# Patient Record
Sex: Female | Born: 1960
Health system: Southern US, Community
[De-identification: ages and names within clinical notes are randomized; demographics above are authoritative.]

## PROBLEM LIST (undated history)

## (undated) DIAGNOSIS — E785 Hyperlipidemia, unspecified: Secondary | ICD-10-CM

## (undated) DIAGNOSIS — M1990 Unspecified osteoarthritis, unspecified site: Secondary | ICD-10-CM

## (undated) DIAGNOSIS — N200 Calculus of kidney: Secondary | ICD-10-CM

## (undated) DIAGNOSIS — I10 Essential (primary) hypertension: Secondary | ICD-10-CM

## (undated) DIAGNOSIS — K5790 Diverticulosis of intestine, part unspecified, without perforation or abscess without bleeding: Secondary | ICD-10-CM

## (undated) DIAGNOSIS — D649 Anemia, unspecified: Secondary | ICD-10-CM

## (undated) DIAGNOSIS — E079 Disorder of thyroid, unspecified: Secondary | ICD-10-CM

## (undated) HISTORY — DX: Anemia, unspecified: D64.9

## (undated) HISTORY — PX: THUMB ARTHROSCOPY: SHX2509

## (undated) HISTORY — PX: ELBOW SURGERY: SHX618

## (undated) HISTORY — DX: Hyperlipidemia, unspecified: E78.5

## (undated) HISTORY — PX: TOE SURGERY: SHX1073

## (undated) HISTORY — DX: Unspecified osteoarthritis, unspecified site: M19.90

## (undated) HISTORY — PX: COLONOSCOPY: SHX174

## (undated) HISTORY — PX: TONSILLECTOMY: SUR1361

## (undated) HISTORY — DX: Disorder of thyroid, unspecified: E07.9

## (undated) HISTORY — PX: TONSILECTOMY, ADENOIDECTOMY, BILATERAL MYRINGOTOMY AND TUBES: SHX2538

## (undated) HISTORY — PX: LITHOTRIPSY: SUR834

## (undated) HISTORY — PX: REPLACEMENT TOTAL KNEE: SUR1224

## (undated) HISTORY — DX: Diverticulosis of intestine, part unspecified, without perforation or abscess without bleeding: K57.90

## (undated) HISTORY — DX: Essential (primary) hypertension: I10

## (undated) HISTORY — PX: CARPAL TUNNEL RELEASE: SHX101

## (undated) HISTORY — DX: Calculus of kidney: N20.0

## (undated) HISTORY — PX: POLYPECTOMY: SHX149

---

## 1997-07-13 ENCOUNTER — Other Ambulatory Visit: Admission: RE | Admit: 1997-07-13 | Discharge: 1997-07-13 | Payer: Self-pay | Admitting: Obstetrics and Gynecology

## 2002-12-22 ENCOUNTER — Encounter: Admission: RE | Admit: 2002-12-22 | Discharge: 2002-12-22 | Payer: Self-pay | Admitting: Obstetrics and Gynecology

## 2005-03-16 ENCOUNTER — Encounter: Admission: RE | Admit: 2005-03-16 | Discharge: 2005-03-16 | Payer: Self-pay | Admitting: Obstetrics and Gynecology

## 2006-10-22 ENCOUNTER — Encounter: Admission: RE | Admit: 2006-10-22 | Discharge: 2006-10-22 | Payer: Self-pay | Admitting: Obstetrics and Gynecology

## 2006-10-25 ENCOUNTER — Encounter: Admission: RE | Admit: 2006-10-25 | Discharge: 2006-10-25 | Payer: Self-pay | Admitting: Obstetrics and Gynecology

## 2007-11-25 ENCOUNTER — Ambulatory Visit: Payer: Self-pay

## 2008-12-22 ENCOUNTER — Encounter: Admission: RE | Admit: 2008-12-22 | Discharge: 2008-12-22 | Payer: Self-pay | Admitting: Obstetrics and Gynecology

## 2009-10-05 ENCOUNTER — Inpatient Hospital Stay (HOSPITAL_COMMUNITY): Admission: RE | Admit: 2009-10-05 | Discharge: 2009-10-08 | Payer: Self-pay | Admitting: Urology

## 2009-12-27 ENCOUNTER — Encounter: Admission: RE | Admit: 2009-12-27 | Discharge: 2009-12-27 | Payer: Self-pay | Admitting: Obstetrics and Gynecology

## 2010-02-03 ENCOUNTER — Encounter (INDEPENDENT_AMBULATORY_CARE_PROVIDER_SITE_OTHER): Payer: Self-pay | Admitting: Internal Medicine

## 2010-02-03 ENCOUNTER — Ambulatory Visit (HOSPITAL_COMMUNITY)
Admission: RE | Admit: 2010-02-03 | Discharge: 2010-02-03 | Payer: Self-pay | Source: Home / Self Care | Attending: Internal Medicine | Admitting: Internal Medicine

## 2010-02-03 ENCOUNTER — Encounter: Payer: Self-pay | Admitting: Cardiology

## 2010-02-03 ENCOUNTER — Ambulatory Visit: Admission: RE | Admit: 2010-02-03 | Discharge: 2010-02-03 | Payer: Self-pay | Source: Home / Self Care

## 2010-02-27 ENCOUNTER — Encounter: Payer: Self-pay | Admitting: Obstetrics and Gynecology

## 2010-04-20 LAB — URINE CULTURE
Colony Count: 75000
Culture  Setup Time: 201109031410
Special Requests: NEGATIVE

## 2010-04-20 LAB — URINE MICROSCOPIC-ADD ON

## 2010-04-20 LAB — URINALYSIS, ROUTINE W REFLEX MICROSCOPIC
Bilirubin Urine: NEGATIVE
Glucose, UA: NEGATIVE mg/dL
Ketones, ur: NEGATIVE mg/dL
Nitrite: NEGATIVE
Protein, ur: 30 mg/dL — AB
Specific Gravity, Urine: 1.01 (ref 1.005–1.030)
Urobilinogen, UA: 1 mg/dL (ref 0.0–1.0)
pH: 6 (ref 5.0–8.0)

## 2010-04-20 LAB — CBC
HCT: 27.9 % — ABNORMAL LOW (ref 36.0–46.0)
HCT: 30.3 % — ABNORMAL LOW (ref 36.0–46.0)
Hemoglobin: 10.4 g/dL — ABNORMAL LOW (ref 12.0–15.0)
Hemoglobin: 9.6 g/dL — ABNORMAL LOW (ref 12.0–15.0)
MCH: 30.8 pg (ref 26.0–34.0)
MCH: 30.9 pg (ref 26.0–34.0)
MCHC: 34.2 g/dL (ref 30.0–36.0)
MCHC: 34.5 g/dL (ref 30.0–36.0)
MCV: 89.1 fL (ref 78.0–100.0)
MCV: 90.4 fL (ref 78.0–100.0)
Platelets: 330 10*3/uL (ref 150–400)
Platelets: 339 10*3/uL (ref 150–400)
RBC: 3.14 MIL/uL — ABNORMAL LOW (ref 3.87–5.11)
RBC: 3.35 MIL/uL — ABNORMAL LOW (ref 3.87–5.11)
RDW: 12.9 % (ref 11.5–15.5)
RDW: 13.4 % (ref 11.5–15.5)
WBC: 13.1 10*3/uL — ABNORMAL HIGH (ref 4.0–10.5)
WBC: 14.7 10*3/uL — ABNORMAL HIGH (ref 4.0–10.5)

## 2010-04-21 LAB — PREGNANCY, URINE: Preg Test, Ur: NEGATIVE

## 2010-04-21 LAB — CBC
HCT: 39.5 % (ref 36.0–46.0)
Hemoglobin: 13.4 g/dL (ref 12.0–15.0)
MCH: 30.4 pg (ref 26.0–34.0)
MCHC: 33.8 g/dL (ref 30.0–36.0)
MCV: 89.9 fL (ref 78.0–100.0)
Platelets: 376 10*3/uL (ref 150–400)
RBC: 4.39 MIL/uL (ref 3.87–5.11)
RDW: 13.3 % (ref 11.5–15.5)
WBC: 6 10*3/uL (ref 4.0–10.5)

## 2010-04-21 LAB — SURGICAL PCR SCREEN
MRSA, PCR: NEGATIVE
Staphylococcus aureus: POSITIVE — AB

## 2010-07-17 ENCOUNTER — Encounter: Payer: Self-pay | Admitting: Internal Medicine

## 2010-08-08 ENCOUNTER — Encounter: Payer: Self-pay | Admitting: Internal Medicine

## 2010-08-08 ENCOUNTER — Ambulatory Visit (AMBULATORY_SURGERY_CENTER): Payer: BC Managed Care – PPO

## 2010-08-08 VITALS — Ht 62.0 in | Wt 167.2 lb

## 2010-08-08 DIAGNOSIS — Z1211 Encounter for screening for malignant neoplasm of colon: Secondary | ICD-10-CM

## 2010-08-08 MED ORDER — PEG-KCL-NACL-NASULF-NA ASC-C 100 G PO SOLR: 1.0000 | Freq: Once | ORAL | Status: AC

## 2010-08-22 ENCOUNTER — Ambulatory Visit (AMBULATORY_SURGERY_CENTER): Payer: BC Managed Care – PPO | Admitting: Internal Medicine

## 2010-08-22 ENCOUNTER — Encounter: Payer: Self-pay | Admitting: Internal Medicine

## 2010-08-22 VITALS — BP 111/67 | HR 62 | Temp 97.7°F | Resp 17 | Ht 62.0 in | Wt 164.0 lb

## 2010-08-22 DIAGNOSIS — D126 Benign neoplasm of colon, unspecified: Secondary | ICD-10-CM

## 2010-08-22 DIAGNOSIS — Z1211 Encounter for screening for malignant neoplasm of colon: Secondary | ICD-10-CM

## 2010-08-22 DIAGNOSIS — K6389 Other specified diseases of intestine: Secondary | ICD-10-CM

## 2010-08-22 DIAGNOSIS — D1330 Benign neoplasm of unspecified part of small intestine: Secondary | ICD-10-CM

## 2010-08-22 DIAGNOSIS — K5730 Diverticulosis of large intestine without perforation or abscess without bleeding: Secondary | ICD-10-CM

## 2010-08-22 DIAGNOSIS — K648 Other hemorrhoids: Secondary | ICD-10-CM

## 2010-08-22 HISTORY — PX: COLONOSCOPY W/ BIOPSIES: SHX1374

## 2010-08-22 MED ORDER — SODIUM CHLORIDE 0.9 % IV SOLN: 500.0000 mL | INTRAVENOUS | Status: DC

## 2010-08-22 NOTE — Patient Instructions (Signed)
Please refer to your blue and neon green sheets for instructions regarding diet and activity for the rest of today.  You may resume your medications as you would normally take them.  

## 2010-08-23 ENCOUNTER — Telehealth: Payer: Self-pay

## 2010-08-23 NOTE — Telephone Encounter (Signed)

## 2010-08-30 ENCOUNTER — Encounter: Payer: Self-pay | Admitting: Internal Medicine

## 2010-08-30 DIAGNOSIS — Z860100 Personal history of colon polyps, unspecified: Secondary | ICD-10-CM | POA: Insufficient documentation

## 2010-08-30 NOTE — Progress Notes (Signed)
Quick Note:  3 mm adenoma Recall colonoscopy 7 years 2019 ______

## 2010-11-30 ENCOUNTER — Other Ambulatory Visit: Payer: Self-pay | Admitting: Internal Medicine

## 2010-11-30 DIAGNOSIS — Z1231 Encounter for screening mammogram for malignant neoplasm of breast: Secondary | ICD-10-CM

## 2011-01-01 ENCOUNTER — Ambulatory Visit: Payer: BC Managed Care – PPO

## 2011-01-22 ENCOUNTER — Ambulatory Visit
Admission: RE | Admit: 2011-01-22 | Discharge: 2011-01-22 | Disposition: A | Discharge status: Home / Self Care | Payer: BC Managed Care – PPO | Source: Ambulatory Visit | Attending: Internal Medicine | Admitting: Internal Medicine

## 2011-01-22 DIAGNOSIS — Z1231 Encounter for screening mammogram for malignant neoplasm of breast: Secondary | ICD-10-CM

## 2011-12-18 ENCOUNTER — Other Ambulatory Visit: Payer: Self-pay | Admitting: Internal Medicine

## 2011-12-18 DIAGNOSIS — Z1231 Encounter for screening mammogram for malignant neoplasm of breast: Secondary | ICD-10-CM

## 2012-02-01 ENCOUNTER — Ambulatory Visit: Payer: BC Managed Care – PPO

## 2012-02-01 ENCOUNTER — Ambulatory Visit
Admission: RE | Admit: 2012-02-01 | Discharge: 2012-02-01 | Disposition: A | Discharge status: Home / Self Care | Payer: BC Managed Care – PPO | Source: Ambulatory Visit | Attending: Internal Medicine | Admitting: Internal Medicine

## 2012-02-01 DIAGNOSIS — Z1231 Encounter for screening mammogram for malignant neoplasm of breast: Secondary | ICD-10-CM

## 2012-12-05 ENCOUNTER — Other Ambulatory Visit: Payer: Self-pay

## 2012-12-05 DIAGNOSIS — Z1231 Encounter for screening mammogram for malignant neoplasm of breast: Secondary | ICD-10-CM

## 2013-02-02 ENCOUNTER — Ambulatory Visit: Payer: BC Managed Care – PPO

## 2014-02-05 HISTORY — PX: BREAST BIOPSY: SHX20

## 2015-02-01 ENCOUNTER — Other Ambulatory Visit: Payer: Self-pay | Admitting: Obstetrics and Gynecology

## 2015-02-01 DIAGNOSIS — R928 Other abnormal and inconclusive findings on diagnostic imaging of breast: Secondary | ICD-10-CM

## 2015-02-09 ENCOUNTER — Ambulatory Visit
Admission: RE | Admit: 2015-02-09 | Discharge: 2015-02-09 | Disposition: A | Discharge status: Home / Self Care | Payer: BLUE CROSS/BLUE SHIELD | Source: Ambulatory Visit | Attending: Obstetrics and Gynecology | Admitting: Obstetrics and Gynecology

## 2015-02-09 ENCOUNTER — Other Ambulatory Visit: Payer: Self-pay | Admitting: Obstetrics and Gynecology

## 2015-02-09 DIAGNOSIS — R928 Other abnormal and inconclusive findings on diagnostic imaging of breast: Secondary | ICD-10-CM

## 2015-02-14 ENCOUNTER — Other Ambulatory Visit: Payer: Self-pay | Admitting: Obstetrics and Gynecology

## 2015-02-14 DIAGNOSIS — R928 Other abnormal and inconclusive findings on diagnostic imaging of breast: Secondary | ICD-10-CM

## 2015-02-15 ENCOUNTER — Ambulatory Visit
Admission: RE | Admit: 2015-02-15 | Discharge: 2015-02-15 | Disposition: A | Discharge status: Home / Self Care | Payer: BLUE CROSS/BLUE SHIELD | Source: Ambulatory Visit | Attending: Obstetrics and Gynecology | Admitting: Obstetrics and Gynecology

## 2015-02-15 DIAGNOSIS — R928 Other abnormal and inconclusive findings on diagnostic imaging of breast: Secondary | ICD-10-CM

## 2015-08-29 DIAGNOSIS — N200 Calculus of kidney: Secondary | ICD-10-CM | POA: Diagnosis not present

## 2015-09-12 DIAGNOSIS — E559 Vitamin D deficiency, unspecified: Secondary | ICD-10-CM | POA: Diagnosis not present

## 2015-09-12 DIAGNOSIS — I10 Essential (primary) hypertension: Secondary | ICD-10-CM | POA: Diagnosis not present

## 2015-09-12 DIAGNOSIS — N390 Urinary tract infection, site not specified: Secondary | ICD-10-CM | POA: Diagnosis not present

## 2015-09-12 DIAGNOSIS — Z0000 Encounter for general adult medical examination without abnormal findings: Secondary | ICD-10-CM | POA: Diagnosis not present

## 2015-09-12 DIAGNOSIS — E784 Other hyperlipidemia: Secondary | ICD-10-CM | POA: Diagnosis not present

## 2015-09-12 DIAGNOSIS — E038 Other specified hypothyroidism: Secondary | ICD-10-CM | POA: Diagnosis not present

## 2015-09-12 DIAGNOSIS — R8299 Other abnormal findings in urine: Secondary | ICD-10-CM | POA: Diagnosis not present

## 2015-09-16 DIAGNOSIS — E038 Other specified hypothyroidism: Secondary | ICD-10-CM | POA: Diagnosis not present

## 2015-09-16 DIAGNOSIS — Z0000 Encounter for general adult medical examination without abnormal findings: Secondary | ICD-10-CM | POA: Diagnosis not present

## 2015-09-16 DIAGNOSIS — E668 Other obesity: Secondary | ICD-10-CM | POA: Diagnosis not present

## 2015-09-16 DIAGNOSIS — N200 Calculus of kidney: Secondary | ICD-10-CM | POA: Diagnosis not present

## 2015-09-16 DIAGNOSIS — Z6832 Body mass index (BMI) 32.0-32.9, adult: Secondary | ICD-10-CM | POA: Diagnosis not present

## 2015-09-16 DIAGNOSIS — I10 Essential (primary) hypertension: Secondary | ICD-10-CM | POA: Diagnosis not present

## 2015-09-16 DIAGNOSIS — Z1389 Encounter for screening for other disorder: Secondary | ICD-10-CM | POA: Diagnosis not present

## 2015-09-22 DIAGNOSIS — Z1212 Encounter for screening for malignant neoplasm of rectum: Secondary | ICD-10-CM | POA: Diagnosis not present

## 2015-10-17 DIAGNOSIS — Z13820 Encounter for screening for osteoporosis: Secondary | ICD-10-CM | POA: Diagnosis not present

## 2015-10-17 DIAGNOSIS — E559 Vitamin D deficiency, unspecified: Secondary | ICD-10-CM | POA: Diagnosis not present

## 2015-11-29 DIAGNOSIS — G5602 Carpal tunnel syndrome, left upper limb: Secondary | ICD-10-CM | POA: Diagnosis not present

## 2015-11-29 DIAGNOSIS — H524 Presbyopia: Secondary | ICD-10-CM | POA: Diagnosis not present

## 2015-12-06 DIAGNOSIS — S134XXA Sprain of ligaments of cervical spine, initial encounter: Secondary | ICD-10-CM | POA: Diagnosis not present

## 2015-12-06 DIAGNOSIS — S335XXA Sprain of ligaments of lumbar spine, initial encounter: Secondary | ICD-10-CM | POA: Diagnosis not present

## 2015-12-06 DIAGNOSIS — M4004 Postural kyphosis, thoracic region: Secondary | ICD-10-CM | POA: Diagnosis not present

## 2015-12-06 DIAGNOSIS — M546 Pain in thoracic spine: Secondary | ICD-10-CM | POA: Diagnosis not present

## 2015-12-17 DIAGNOSIS — G5602 Carpal tunnel syndrome, left upper limb: Secondary | ICD-10-CM | POA: Diagnosis not present

## 2015-12-23 ENCOUNTER — Other Ambulatory Visit: Payer: Self-pay | Admitting: Obstetrics and Gynecology

## 2015-12-23 DIAGNOSIS — Z1231 Encounter for screening mammogram for malignant neoplasm of breast: Secondary | ICD-10-CM

## 2016-01-11 DIAGNOSIS — G5622 Lesion of ulnar nerve, left upper limb: Secondary | ICD-10-CM | POA: Diagnosis not present

## 2016-01-11 DIAGNOSIS — M1812 Unilateral primary osteoarthritis of first carpometacarpal joint, left hand: Secondary | ICD-10-CM | POA: Diagnosis not present

## 2016-01-11 DIAGNOSIS — G5602 Carpal tunnel syndrome, left upper limb: Secondary | ICD-10-CM | POA: Diagnosis not present

## 2016-01-17 DIAGNOSIS — G5622 Lesion of ulnar nerve, left upper limb: Secondary | ICD-10-CM | POA: Diagnosis not present

## 2016-01-17 DIAGNOSIS — G5602 Carpal tunnel syndrome, left upper limb: Secondary | ICD-10-CM | POA: Diagnosis not present

## 2016-01-17 DIAGNOSIS — M1812 Unilateral primary osteoarthritis of first carpometacarpal joint, left hand: Secondary | ICD-10-CM | POA: Diagnosis not present

## 2016-01-17 DIAGNOSIS — G5621 Lesion of ulnar nerve, right upper limb: Secondary | ICD-10-CM | POA: Diagnosis not present

## 2016-01-17 DIAGNOSIS — M13142 Monoarthritis, not elsewhere classified, left hand: Secondary | ICD-10-CM | POA: Diagnosis not present

## 2016-01-25 DIAGNOSIS — G5602 Carpal tunnel syndrome, left upper limb: Secondary | ICD-10-CM | POA: Diagnosis not present

## 2016-01-25 DIAGNOSIS — Z4789 Encounter for other orthopedic aftercare: Secondary | ICD-10-CM | POA: Diagnosis not present

## 2016-01-25 DIAGNOSIS — M25522 Pain in left elbow: Secondary | ICD-10-CM | POA: Diagnosis not present

## 2016-01-31 DIAGNOSIS — M25522 Pain in left elbow: Secondary | ICD-10-CM | POA: Diagnosis not present

## 2016-02-01 ENCOUNTER — Ambulatory Visit
Admission: RE | Admit: 2016-02-01 | Discharge: 2016-02-01 | Disposition: A | Discharge status: Home / Self Care | Payer: BLUE CROSS/BLUE SHIELD | Source: Ambulatory Visit | Attending: Obstetrics and Gynecology | Admitting: Obstetrics and Gynecology

## 2016-02-01 DIAGNOSIS — Z130 Encounter for screening for diseases of the blood and blood-forming organs and certain disorders involving the immune mechanism: Secondary | ICD-10-CM | POA: Diagnosis not present

## 2016-02-01 DIAGNOSIS — Z1231 Encounter for screening mammogram for malignant neoplasm of breast: Secondary | ICD-10-CM | POA: Diagnosis not present

## 2016-02-01 DIAGNOSIS — Z124 Encounter for screening for malignant neoplasm of cervix: Secondary | ICD-10-CM | POA: Diagnosis not present

## 2016-02-01 DIAGNOSIS — Z01419 Encounter for gynecological examination (general) (routine) without abnormal findings: Secondary | ICD-10-CM | POA: Diagnosis not present

## 2016-02-01 DIAGNOSIS — Z1389 Encounter for screening for other disorder: Secondary | ICD-10-CM | POA: Diagnosis not present

## 2016-03-16 DIAGNOSIS — I10 Essential (primary) hypertension: Secondary | ICD-10-CM | POA: Diagnosis not present

## 2016-03-16 DIAGNOSIS — N393 Stress incontinence (female) (male): Secondary | ICD-10-CM | POA: Diagnosis not present

## 2016-03-16 DIAGNOSIS — G4709 Other insomnia: Secondary | ICD-10-CM | POA: Diagnosis not present

## 2016-03-16 DIAGNOSIS — Z23 Encounter for immunization: Secondary | ICD-10-CM | POA: Diagnosis not present

## 2016-03-16 DIAGNOSIS — E038 Other specified hypothyroidism: Secondary | ICD-10-CM | POA: Diagnosis not present

## 2016-03-16 DIAGNOSIS — E668 Other obesity: Secondary | ICD-10-CM | POA: Diagnosis not present

## 2016-10-05 DIAGNOSIS — E038 Other specified hypothyroidism: Secondary | ICD-10-CM | POA: Diagnosis not present

## 2016-10-05 DIAGNOSIS — Z0000 Encounter for general adult medical examination without abnormal findings: Secondary | ICD-10-CM | POA: Diagnosis not present

## 2016-10-05 DIAGNOSIS — I10 Essential (primary) hypertension: Secondary | ICD-10-CM | POA: Diagnosis not present

## 2016-10-05 DIAGNOSIS — E559 Vitamin D deficiency, unspecified: Secondary | ICD-10-CM | POA: Diagnosis not present

## 2016-10-12 DIAGNOSIS — Z1389 Encounter for screening for other disorder: Secondary | ICD-10-CM | POA: Diagnosis not present

## 2016-10-12 DIAGNOSIS — E668 Other obesity: Secondary | ICD-10-CM | POA: Diagnosis not present

## 2016-10-12 DIAGNOSIS — Z0000 Encounter for general adult medical examination without abnormal findings: Secondary | ICD-10-CM | POA: Diagnosis not present

## 2016-10-12 DIAGNOSIS — I10 Essential (primary) hypertension: Secondary | ICD-10-CM | POA: Diagnosis not present

## 2016-10-12 DIAGNOSIS — G4709 Other insomnia: Secondary | ICD-10-CM | POA: Diagnosis not present

## 2016-10-12 DIAGNOSIS — E038 Other specified hypothyroidism: Secondary | ICD-10-CM | POA: Diagnosis not present

## 2016-10-19 DIAGNOSIS — Z1212 Encounter for screening for malignant neoplasm of rectum: Secondary | ICD-10-CM | POA: Diagnosis not present

## 2016-10-22 DIAGNOSIS — M25521 Pain in right elbow: Secondary | ICD-10-CM | POA: Diagnosis not present

## 2016-10-22 DIAGNOSIS — G5601 Carpal tunnel syndrome, right upper limb: Secondary | ICD-10-CM | POA: Diagnosis not present

## 2016-10-22 DIAGNOSIS — M79641 Pain in right hand: Secondary | ICD-10-CM | POA: Diagnosis not present

## 2016-12-14 DIAGNOSIS — M1812 Unilateral primary osteoarthritis of first carpometacarpal joint, left hand: Secondary | ICD-10-CM | POA: Diagnosis not present

## 2016-12-14 DIAGNOSIS — G5601 Carpal tunnel syndrome, right upper limb: Secondary | ICD-10-CM | POA: Diagnosis not present

## 2016-12-14 DIAGNOSIS — M79641 Pain in right hand: Secondary | ICD-10-CM | POA: Diagnosis not present

## 2017-01-17 ENCOUNTER — Other Ambulatory Visit: Payer: Self-pay | Admitting: Obstetrics and Gynecology

## 2017-01-17 DIAGNOSIS — Z1231 Encounter for screening mammogram for malignant neoplasm of breast: Secondary | ICD-10-CM

## 2017-02-04 ENCOUNTER — Ambulatory Visit
Admission: RE | Admit: 2017-02-04 | Discharge: 2017-02-04 | Disposition: A | Discharge status: Home / Self Care | Payer: BLUE CROSS/BLUE SHIELD | Source: Ambulatory Visit | Attending: Obstetrics and Gynecology | Admitting: Obstetrics and Gynecology

## 2017-02-04 DIAGNOSIS — Z1231 Encounter for screening mammogram for malignant neoplasm of breast: Secondary | ICD-10-CM | POA: Diagnosis not present

## 2017-02-04 DIAGNOSIS — R87619 Unspecified abnormal cytological findings in specimens from cervix uteri: Secondary | ICD-10-CM | POA: Diagnosis not present

## 2017-02-04 DIAGNOSIS — Z01419 Encounter for gynecological examination (general) (routine) without abnormal findings: Secondary | ICD-10-CM | POA: Diagnosis not present

## 2017-02-04 DIAGNOSIS — Z124 Encounter for screening for malignant neoplasm of cervix: Secondary | ICD-10-CM | POA: Diagnosis not present

## 2017-02-04 DIAGNOSIS — Z130 Encounter for screening for diseases of the blood and blood-forming organs and certain disorders involving the immune mechanism: Secondary | ICD-10-CM | POA: Diagnosis not present

## 2017-02-04 DIAGNOSIS — Z6831 Body mass index (BMI) 31.0-31.9, adult: Secondary | ICD-10-CM | POA: Diagnosis not present

## 2017-02-04 DIAGNOSIS — Z1389 Encounter for screening for other disorder: Secondary | ICD-10-CM | POA: Diagnosis not present

## 2017-02-04 DIAGNOSIS — R87610 Atypical squamous cells of undetermined significance on cytologic smear of cervix (ASC-US): Secondary | ICD-10-CM | POA: Diagnosis not present

## 2017-02-07 ENCOUNTER — Emergency Department (HOSPITAL_COMMUNITY): Payer: BLUE CROSS/BLUE SHIELD

## 2017-02-07 ENCOUNTER — Encounter (HOSPITAL_COMMUNITY): Payer: Self-pay | Admitting: Emergency Medicine

## 2017-02-07 ENCOUNTER — Other Ambulatory Visit: Payer: Self-pay

## 2017-02-07 DIAGNOSIS — R079 Chest pain, unspecified: Secondary | ICD-10-CM | POA: Diagnosis not present

## 2017-02-07 DIAGNOSIS — I10 Essential (primary) hypertension: Secondary | ICD-10-CM | POA: Insufficient documentation

## 2017-02-07 DIAGNOSIS — Z79899 Other long term (current) drug therapy: Secondary | ICD-10-CM | POA: Diagnosis not present

## 2017-02-07 DIAGNOSIS — R51 Headache: Secondary | ICD-10-CM | POA: Insufficient documentation

## 2017-02-07 DIAGNOSIS — R0789 Other chest pain: Secondary | ICD-10-CM | POA: Diagnosis not present

## 2017-02-07 LAB — BASIC METABOLIC PANEL
ANION GAP: 9 (ref 5–15)
BUN: 18 mg/dL (ref 6–20)
CHLORIDE: 105 mmol/L (ref 101–111)
CO2: 26 mmol/L (ref 22–32)
Calcium: 9.3 mg/dL (ref 8.9–10.3)
Creatinine, Ser: 0.88 mg/dL (ref 0.44–1.00)
GFR calc non Af Amer: >60 mL/min (ref >60)
Glucose, Bld: 101 mg/dL — ABNORMAL HIGH (ref 65–99)
Potassium: 3.4 mmol/L — ABNORMAL LOW (ref 3.5–5.1)
Sodium: 140 mmol/L (ref 135–145)

## 2017-02-07 LAB — I-STAT BETA HCG BLOOD, ED (MC, WL, AP ONLY): I-stat hCG, quantitative: <5 m[IU]/mL (ref <5)

## 2017-02-07 LAB — CBC
HCT: 43.3 % (ref 36.0–46.0)
HEMOGLOBIN: 14.3 g/dL (ref 12.0–15.0)
MCH: 30 pg (ref 26.0–34.0)
MCHC: 33 g/dL (ref 30.0–36.0)
MCV: 90.8 fL (ref 78.0–100.0)
Platelets: 298 10*3/uL (ref 150–400)
RBC: 4.77 MIL/uL (ref 3.87–5.11)
RDW: 13.9 % (ref 11.5–15.5)
WBC: 10.5 10*3/uL (ref 4.0–10.5)

## 2017-02-07 LAB — I-STAT TROPONIN, ED: Troponin i, poc: 0 ng/mL (ref 0.00–0.08)

## 2017-02-07 NOTE — ED Triage Notes (Signed)
Pt c/o chest heaviness, jaw pain and hypertension x 3 days. Denies n/v/diaphoresis. Hx HTN.

## 2017-02-08 ENCOUNTER — Emergency Department (HOSPITAL_COMMUNITY)
Admission: EM | Admit: 2017-02-08 | Discharge: 2017-02-08 | Disposition: A | Discharge status: Home / Self Care | Payer: BLUE CROSS/BLUE SHIELD | Attending: Emergency Medicine | Admitting: Emergency Medicine

## 2017-02-08 DIAGNOSIS — I10 Essential (primary) hypertension: Secondary | ICD-10-CM | POA: Diagnosis not present

## 2017-02-08 DIAGNOSIS — J309 Allergic rhinitis, unspecified: Secondary | ICD-10-CM | POA: Diagnosis not present

## 2017-02-08 DIAGNOSIS — R600 Localized edema: Secondary | ICD-10-CM | POA: Diagnosis not present

## 2017-02-08 DIAGNOSIS — J0100 Acute maxillary sinusitis, unspecified: Secondary | ICD-10-CM | POA: Diagnosis not present

## 2017-02-08 DIAGNOSIS — R079 Chest pain, unspecified: Secondary | ICD-10-CM

## 2017-02-08 DIAGNOSIS — R519 Headache, unspecified: Secondary | ICD-10-CM

## 2017-02-08 DIAGNOSIS — R51 Headache: Secondary | ICD-10-CM

## 2017-02-08 MED ORDER — ACETAMINOPHEN 325 MG PO TABS
650.0000 mg | ORAL_TABLET | Freq: Once | ORAL | Status: AC
  Administered 2017-02-08: 650 mg via ORAL
  Filled 2017-02-08: qty 2

## 2017-02-08 MED ORDER — NAPROXEN 250 MG PO TABS
375.0000 mg | ORAL_TABLET | Freq: Once | ORAL | Status: AC
  Administered 2017-02-08: 375 mg via ORAL
  Filled 2017-02-08: qty 2

## 2017-02-08 NOTE — ED Notes (Signed)
Pt states that BP was elevated at OB/GYN on Monday; patient then stated that she begin having jaw pain on the R side along with headache; pt states that pain is 9/10. Pt states that she took tylenol (650 mg) and aleve approx 6 hrs ago. Pt states that she went to dentist at 1p today to r/o dental issues. Pt states that she went to [employer] to retake BP; but feels that elevated BP is due to jaw pain. Pt states pai radiates to R side of neck "it almost feels like something is poking into R side of neck." A/Ox4

## 2017-02-08 NOTE — Discharge Instructions (Signed)
No explanation for your right jaw/facial pain is identified, but your exam and lab studies are reassuring without evidence of infection or acute process requiring urgent intervention. Please follow up with Dr. Virgina Jock for recheck in 2 days if pain is still present for re-examination and any further treatment options. Return to the emergency department with any new or concerning symptoms.

## 2017-02-08 NOTE — ED Provider Notes (Signed)
ED ECG REPORT   Date: 02/08/2017  Rate: 67  Rhythm: normal sinus rhythm  QRS Axis: normal  Intervals: normal  ST/T Wave abnormalities: nonspecific T wave changes  Conduction Disutrbances:LVH  Narrative Interpretation:   Old EKG Reviewed: changes noted from 28 Sep 2009 Godwin now present  I have personally reviewed the EKG tracing and agree with the computerized printout as noted.  Medical screening examination/treatment/procedure(s) were performed by non-physician practitioner and as supervising physician I was immediately available for consultation/collaboration.  Rolland Porter, MD, Barbette Or, MD 02/08/17 [phone]

## 2017-02-08 NOTE — ED Provider Notes (Signed)
[facility] DEPART[facility] Provider Note   CSN: [id number] Arrival date & time: 02/07/17  1611     History   Chief Complaint Chief Complaint  Patient presents with  . Chest Pain    HPI Stacy Brady is a 57 y.o. female.  Patient with history of HTN presents with complaint of chest pressure and right jaw pain since yesterday. No SOB, N, V, diaphoresis. No fever or cough. She does not feel the chest pressure radiates to jaw and describes the pressure as mild. The jaw pain has been significant but relieved with Tylenol and Aleve. No dental issues. She went to her dentist today and reports no cause was identified for her jaw discomfort. No facial swelling or ear pain. No sore throat or difficulty swallowing.    The history is provided by the patient and the spouse. No language interpreter was used.  Chest Pain   Pertinent negatives include no abdominal pain, no cough, no fever, no nausea, no shortness of breath and no weakness.    Past Medical History:  Diagnosis Date  . Hypertension   . Nephrolithiasis   . Thyroid disease     Patient Active Problem List   Diagnosis Date Noted  . Personal history of colonic adenoma 08/30/2010    Past Surgical History:  Procedure Laterality Date  . BREAST BIOPSY Right 2016  . CESAREAN SECTION     1 time  . COLONOSCOPY W/ BIOPSIES  08/22/2010   diminutive adenoma, diverticulosis, internal hemorrhoids, melanosis  . LITHOTRIPSY     nephrolithiasis  . TONSILECTOMY, ADENOIDECTOMY, BILATERAL MYRINGOTOMY AND TUBES      OB History    No data available       Home Medications    Prior to Admission medications   Medication Sig Start Date End Date Taking? Authorizing Provider  acetaminophen (TYLENOL) 650 MG CR tablet Take 650 mg by mouth daily. Take 2 daily prn     [provider]  bisoprolol-hydrochlorothiazide (ZIAC) 2.5-6.25 MG per tablet Take 1 tablet by mouth daily. Take 2 tablets daily     [provider]  Calcium Carbonate-Vitamin D (LIQUID CALCIUM/VITAMIN D PO) Take by mouth. Take 6000 IU daily-liquid     [provider]  Ibuprofen-Diphenhydramine Cit (ADVIL PM PO) Take by mouth.      [provider]  levothyroxine (SYNTHROID, LEVOTHROID) 175 MCG tablet Take 175 mcg by mouth daily. Daily / except Sunday     [provider]  magnesium gluconate (MAGONATE) 500 MG tablet Take 500 mg by mouth daily.      [provider]  Misc Natural Products (COLON HERBAL CLEANSER PO) Take by mouth as needed.      [provider]  naproxen sodium (ANAPROX) 220 MG tablet Take 220 mg by mouth 2 (two) times daily as needed.      [provider]  pyridoxine (B-6) 200 MG tablet Take 200 mg by mouth daily.      [provider]    Family History Family History  Problem Relation Age of Onset  . Diabetes Mother   . Diabetes Father     Social History Social History   Tobacco Use  . Smoking status: Never Smoker  . Smokeless tobacco: Never Used  Substance Use Topics  . Alcohol use: No  . Drug use: No     Allergies   Patient has no known allergies.   Review of Systems Review of Systems  Constitutional: Negative for  chills and fever.  HENT: Negative for dental problem, ear pain, facial swelling, sore throat and trouble swallowing.        C/o right jaw pain  Respiratory: Negative.  Negative for cough and shortness of breath.   Cardiovascular: Positive for chest pain. Negative for leg swelling.  Gastrointestinal: Negative.  Negative for abdominal pain and nausea.  Musculoskeletal: Negative.   Skin: Negative.   Neurological: Negative.  Negative for weakness.     Physical Exam Updated Vital Signs BP (!) 155/99   Pulse (!) 51   Temp 98.2 F (36.8 C) (Oral)   Resp 17   Ht 5\' 1"  (1.549 m)   Wt 77.6 kg (171 lb)   LMP 08/11/2010   SpO2 95%   BMI 32.31 kg/m   Physical Exam  Constitutional: She appears well-developed and  well-nourished.  HENT:  Head: Normocephalic.  No facial swelling. Minimal facial tenderness. Generally good dentition. No intraoral abnormalities. Oropharynx is benign. Right TM normal.   Neck: Normal range of motion. Neck supple.  Cardiovascular: Normal rate, regular rhythm, intact distal pulses and normal pulses.  No carotid bruit  Pulmonary/Chest: Effort normal and breath sounds normal.  Abdominal: Soft. Bowel sounds are normal. There is no tenderness. There is no rebound and no guarding.  Musculoskeletal: Normal range of motion.       Right lower leg: She exhibits no edema.       Left lower leg: She exhibits no edema.  Lymphadenopathy:       Head (right side): No submental, no submandibular, no preauricular and no posterior auricular adenopathy present.    She has no cervical adenopathy.  Neurological: She is alert. No cranial nerve deficit.  Skin: Skin is warm and dry.  Psychiatric: She has a normal mood and affect.  Nursing note and vitals reviewed.    ED Treatments / Results  Labs (all labs ordered are listed, but only abnormal results are displayed) Labs Reviewed  BASIC METABOLIC PANEL - Abnormal; Notable for the following components:      Result Value   Potassium 3.4 (*)    Glucose, Bld 101 (*)    All other components within normal limits  CBC  I-STAT TROPONIN, ED  I-STAT BETA HCG BLOOD, ED ([facility], [facility], [facility] ONLY)   Results for orders placed or performed during the hospital encounter of 25/05/39  Basic metabolic panel  Result Value Ref Range   Sodium 140 135 - 145 mmol/L   Potassium 3.4 (L) 3.5 - 5.1 mmol/L   Chloride 105 101 - 111 mmol/L   CO2 26 22 - 32 mmol/L   Glucose, Bld 101 (H) 65 - 99 mg/dL   BUN 18 6 - 20 mg/dL   Creatinine, Ser 0.88 0.44 - 1.00 mg/dL   Calcium 9.3 8.9 - 10.3 mg/dL   GFR calc non Af Amer >60 >60 mL/min   GFR calc Af Amer >60 >60 mL/min   Anion gap 9 5 - 15  CBC  Result Value Ref Range   WBC 10.5 4.0 - 10.5 K/uL   RBC 4.77 3.87 - 5.11  MIL/uL   Hemoglobin 14.3 12.0 - 15.0 g/dL   HCT 43.3 36.0 - 46.0 %   MCV 90.8 78.0 - 100.0 fL   MCH 30.0 26.0 - 34.0 pg   MCHC 33.0 30.0 - 36.0 g/dL   RDW 13.9 11.5 - 15.5 %   Platelets 298 150 - 400 K/uL  I-stat troponin, ED  Result Value Ref Range   Troponin  i, poc 0.00 0.00 - 0.08 ng/mL   Comment 3          I-Stat beta hCG blood, ED  Result Value Ref Range   I-stat hCG, quantitative <5.0 <5 mIU/mL   Comment 3            EKG  EKG Interpretation None       Radiology Dg Chest 2 View  Result Date: 02/07/2017 CLINICAL DATA:  Mid chest discomfort. EXAM: CHEST  2 VIEW COMPARISON:  None. FINDINGS: The heart size and mediastinal contours are within normal limits. Tortuous thoracic aorta without aneurysm. Both lungs are clear. Mild thoracic spondylosis with multilevel degenerative disc disease. IMPRESSION: No active cardiopulmonary disease.  Mild thoracic spondylosis. Electronically Signed   By: Ashley Royalty M.D.   On: 02/07/2017 17:29    Procedures Procedures (including critical care time)  Medications Ordered in ED Medications  naproxen (NAPROSYN) tablet 375 mg (not administered)  acetaminophen (TYLENOL) tablet 650 mg (not administered)     Initial Impression / Assessment and Plan / ED Course  I have reviewed the triage vital signs and the nursing notes.  Pertinent labs & imaging results that were available during my care of the patient were reviewed by me and considered in my medical decision making (see chart for details).     Patient presents with central chest pressure and right jaw pain. Symptoms started yesterday and are controlled with Tylenol and Aleve.   EKG is nonacute. Labs, including troponin are negative. CXR is negative. Symptoms are not typical for ACS. Heart Score 2. Symptoms greater than 24 hours - Delta troponin is not warranted.   She has seen her dentist who does not feel there is a dental origin of her jaw pain. There is no TMJ tenderness to palpation  or ROM.   Unknown cause of facial/jaw pain but feel it is benign. Patient treated with Tylenol and Aleve and reports pain is improved. Will recommend continuation of this regimen at home. Follow up with PCP in 2-3 days for recheck and any further outpatient testing felt necessary.    Final Clinical Impressions(s) / ED Diagnoses   Final diagnoses:  None   1. Chest pressure 2. Right facial pain  ED Discharge Orders    None       Charlann Lange, PA-C 02/08/17 Anola Gurney    Rolland Porter, MD 02/08/17 [phone]

## 2017-02-15 ENCOUNTER — Ambulatory Visit: Payer: BLUE CROSS/BLUE SHIELD

## 2017-02-21 DIAGNOSIS — M4004 Postural kyphosis, thoracic region: Secondary | ICD-10-CM | POA: Diagnosis not present

## 2017-02-21 DIAGNOSIS — S134XXA Sprain of ligaments of cervical spine, initial encounter: Secondary | ICD-10-CM | POA: Diagnosis not present

## 2017-02-21 DIAGNOSIS — M546 Pain in thoracic spine: Secondary | ICD-10-CM | POA: Diagnosis not present

## 2017-02-21 DIAGNOSIS — S335XXA Sprain of ligaments of lumbar spine, initial encounter: Secondary | ICD-10-CM | POA: Diagnosis not present

## 2017-05-01 DIAGNOSIS — M546 Pain in thoracic spine: Secondary | ICD-10-CM | POA: Diagnosis not present

## 2017-05-01 DIAGNOSIS — S134XXA Sprain of ligaments of cervical spine, initial encounter: Secondary | ICD-10-CM | POA: Diagnosis not present

## 2017-05-01 DIAGNOSIS — S335XXA Sprain of ligaments of lumbar spine, initial encounter: Secondary | ICD-10-CM | POA: Diagnosis not present

## 2017-05-01 DIAGNOSIS — M4004 Postural kyphosis, thoracic region: Secondary | ICD-10-CM | POA: Diagnosis not present

## 2017-07-12 DIAGNOSIS — Z6834 Body mass index (BMI) 34.0-34.9, adult: Secondary | ICD-10-CM | POA: Diagnosis not present

## 2017-07-12 DIAGNOSIS — J029 Acute pharyngitis, unspecified: Secondary | ICD-10-CM | POA: Diagnosis not present

## 2017-07-26 DIAGNOSIS — E668 Other obesity: Secondary | ICD-10-CM | POA: Diagnosis not present

## 2017-07-26 DIAGNOSIS — E038 Other specified hypothyroidism: Secondary | ICD-10-CM | POA: Diagnosis not present

## 2017-07-26 DIAGNOSIS — I10 Essential (primary) hypertension: Secondary | ICD-10-CM | POA: Diagnosis not present

## 2017-07-26 DIAGNOSIS — J0100 Acute maxillary sinusitis, unspecified: Secondary | ICD-10-CM | POA: Diagnosis not present

## 2017-10-11 ENCOUNTER — Other Ambulatory Visit: Payer: Self-pay | Admitting: Internal Medicine

## 2017-10-11 DIAGNOSIS — E559 Vitamin D deficiency, unspecified: Secondary | ICD-10-CM | POA: Diagnosis not present

## 2017-10-11 DIAGNOSIS — R82998 Other abnormal findings in urine: Secondary | ICD-10-CM | POA: Diagnosis not present

## 2017-10-11 DIAGNOSIS — E668 Other obesity: Secondary | ICD-10-CM | POA: Diagnosis not present

## 2017-10-11 DIAGNOSIS — Z0000 Encounter for general adult medical examination without abnormal findings: Secondary | ICD-10-CM | POA: Diagnosis not present

## 2017-10-11 DIAGNOSIS — Z1389 Encounter for screening for other disorder: Secondary | ICD-10-CM | POA: Diagnosis not present

## 2017-10-11 DIAGNOSIS — R140 Abdominal distension (gaseous): Secondary | ICD-10-CM

## 2017-10-11 DIAGNOSIS — E038 Other specified hypothyroidism: Secondary | ICD-10-CM | POA: Diagnosis not present

## 2017-10-11 DIAGNOSIS — R109 Unspecified abdominal pain: Secondary | ICD-10-CM

## 2017-10-11 DIAGNOSIS — I10 Essential (primary) hypertension: Secondary | ICD-10-CM | POA: Diagnosis not present

## 2017-10-14 ENCOUNTER — Ambulatory Visit
Admission: RE | Admit: 2017-10-14 | Discharge: 2017-10-14 | Disposition: A | Discharge status: Home / Self Care | Payer: BLUE CROSS/BLUE SHIELD | Source: Ambulatory Visit | Attending: Internal Medicine | Admitting: Internal Medicine

## 2017-10-14 ENCOUNTER — Encounter: Payer: Self-pay | Admitting: Radiology

## 2017-10-14 DIAGNOSIS — R140 Abdominal distension (gaseous): Secondary | ICD-10-CM

## 2017-10-14 DIAGNOSIS — R109 Unspecified abdominal pain: Secondary | ICD-10-CM

## 2017-10-14 DIAGNOSIS — N200 Calculus of kidney: Secondary | ICD-10-CM | POA: Diagnosis not present

## 2017-10-14 MED ORDER — IOPAMIDOL (ISOVUE-300) INJECTION 61%
100.0000 mL | Freq: Once | INTRAVENOUS | Status: AC | PRN
  Administered 2017-10-14: 100 mL via INTRAVENOUS

## 2017-10-18 DIAGNOSIS — Z1212 Encounter for screening for malignant neoplasm of rectum: Secondary | ICD-10-CM | POA: Diagnosis not present

## 2017-10-18 DIAGNOSIS — R109 Unspecified abdominal pain: Secondary | ICD-10-CM | POA: Diagnosis not present

## 2017-10-18 DIAGNOSIS — N200 Calculus of kidney: Secondary | ICD-10-CM | POA: Diagnosis not present

## 2017-12-02 ENCOUNTER — Encounter: Payer: Self-pay | Admitting: Internal Medicine

## 2017-12-31 ENCOUNTER — Encounter: Payer: Self-pay | Admitting: Internal Medicine

## 2018-02-07 ENCOUNTER — Ambulatory Visit (AMBULATORY_SURGERY_CENTER): Payer: Self-pay

## 2018-02-07 VITALS — Ht 61.0 in | Wt 179.4 lb

## 2018-02-07 DIAGNOSIS — Z86010 Personal history of colonic polyps: Secondary | ICD-10-CM

## 2018-02-07 NOTE — Progress Notes (Signed)
No egg or soy allergy known to patient  No issues with past sedation with any surgeries  or procedures, no intubation problems  No diet pills per patient No home 02 use per patient  No blood thinners per patient  Pt denies issues with constipation  No A fib or A flutter  EMMI video sent to pt's e mail pt declined   

## 2018-02-11 ENCOUNTER — Encounter: Payer: Self-pay | Admitting: Internal Medicine

## 2018-02-12 DIAGNOSIS — Z130 Encounter for screening for diseases of the blood and blood-forming organs and certain disorders involving the immune mechanism: Secondary | ICD-10-CM | POA: Diagnosis not present

## 2018-02-12 DIAGNOSIS — R8290 Unspecified abnormal findings in urine: Secondary | ICD-10-CM | POA: Diagnosis not present

## 2018-02-12 DIAGNOSIS — M79642 Pain in left hand: Secondary | ICD-10-CM | POA: Diagnosis not present

## 2018-02-12 DIAGNOSIS — M180 Bilateral primary osteoarthritis of first carpometacarpal joints: Secondary | ICD-10-CM | POA: Diagnosis not present

## 2018-02-12 DIAGNOSIS — M79641 Pain in right hand: Secondary | ICD-10-CM | POA: Diagnosis not present

## 2018-02-12 DIAGNOSIS — Z1389 Encounter for screening for other disorder: Secondary | ICD-10-CM | POA: Diagnosis not present

## 2018-02-12 DIAGNOSIS — Z124 Encounter for screening for malignant neoplasm of cervix: Secondary | ICD-10-CM | POA: Diagnosis not present

## 2018-02-12 DIAGNOSIS — Z1231 Encounter for screening mammogram for malignant neoplasm of breast: Secondary | ICD-10-CM | POA: Diagnosis not present

## 2018-02-12 DIAGNOSIS — Z01419 Encounter for gynecological examination (general) (routine) without abnormal findings: Secondary | ICD-10-CM | POA: Diagnosis not present

## 2018-02-18 ENCOUNTER — Ambulatory Visit (AMBULATORY_SURGERY_CENTER): Payer: BLUE CROSS/BLUE SHIELD | Admitting: Internal Medicine

## 2018-02-18 ENCOUNTER — Encounter: Payer: Self-pay | Admitting: Internal Medicine

## 2018-02-18 VITALS — BP 103/83 | HR 55 | Temp 99.1°F | Resp 16 | Ht 61.0 in | Wt 179.0 lb

## 2018-02-18 DIAGNOSIS — D123 Benign neoplasm of transverse colon: Secondary | ICD-10-CM

## 2018-02-18 DIAGNOSIS — Z86010 Personal history of colonic polyps: Secondary | ICD-10-CM | POA: Diagnosis not present

## 2018-02-18 DIAGNOSIS — D124 Benign neoplasm of descending colon: Secondary | ICD-10-CM

## 2018-02-18 MED ORDER — SODIUM CHLORIDE 0.9 % IV SOLN: 500.0000 mL | Freq: Once | INTRAVENOUS | Status: DC

## 2018-02-18 NOTE — Patient Instructions (Addendum)
   I found and removed 2 tiny polyps.  I will let you know pathology results and when to have another routine colonoscopy by mail and/or My Chart. I appreciate the opportunity to care for you.  Gatha Mayer, MD, [facility]  Information on diverticulosis given to you today.  YOU HAD AN ENDOSCOPIC PROCEDURE TODAY AT [facility]:   Refer to the procedure report that was given to you for any specific questions about what was found during the examination.  If the procedure report does not answer your questions, please call your gastroenterologist to clarify.  If you requested that your care partner not be given the details of your procedure findings, then the procedure report has been included in a sealed envelope for you to review at your convenience later.  YOU SHOULD EXPECT: Some feelings of bloating in the abdomen. Passage of more gas than usual.  Walking can help get rid of the air that was put into your GI tract during the procedure and reduce the bloating. If you had a lower endoscopy (such as a colonoscopy or flexible sigmoidoscopy) you may notice spotting of blood in your stool or on the toilet paper. If you underwent a bowel prep for your procedure, you may not have a normal bowel movement for a few days.  Please Note:  You might notice some irritation and congestion in your nose or some drainage.  This is from the oxygen used during your procedure.  There is no need for concern and it should clear up in a day or so.  SYMPTOMS TO REPORT IMMEDIATELY:   Following lower endoscopy (colonoscopy or flexible sigmoidoscopy):  Excessive amounts of blood in the stool  Significant tenderness or worsening of abdominal pains  Swelling of the abdomen that is new, acute  Fever of 100F or higher  For urgent or emergent issues, a gastroenterologist can be reached at any hour by calling [phone].   DIET:  We do recommend a small meal at first, but then you may proceed to your  regular diet.  Drink plenty of fluids but you should avoid alcoholic beverages for 24 hours.  ACTIVITY:  You should plan to take it easy for the rest of today and you should NOT DRIVE or use heavy machinery until tomorrow (because of the sedation medicines used during the test).    FOLLOW UP: Our staff will call the number listed on your records the next business day following your procedure to check on you and address any questions or concerns that you may have regarding the information given to you following your procedure. If we do not reach you, we will leave a message.  However, if you are feeling well and you are not experiencing any problems, there is no need to return our call.  We will assume that you have returned to your regular daily activities without incident.  If any biopsies were taken you will be contacted by phone or by letter within the next 1-3 weeks.  Please call us at [phone] if you have not heard about the biopsies in 3 weeks.    SIGNATURES/CONFIDENTIALITY: You and/or your care partner have signed paperwork which will be entered into your electronic medical record.  These signatures attest to the fact that that the information above on your After Visit Summary has been reviewed and is understood.  Full responsibility of the confidentiality of this discharge information lies with you and/or your care-partner.

## 2018-02-18 NOTE — Progress Notes (Signed)
Spontaneous respirations throughout. VSS. Resting comfortably. To PACU on room air. Report to  RN. 

## 2018-02-18 NOTE — Progress Notes (Signed)
Called to room to assist during endoscopic procedure.  Patient ID and intended procedure confirmed with present staff. Received instructions for my participation in the procedure from the performing physician.  

## 2018-02-18 NOTE — Op Note (Signed)
[facility] Patient Name: Stacy Brady Procedure Date: 02/18/2018 9:48 AM MRN: [id number] Endoscopist: Gatha Mayer , MD Age: 58 Referring MD:  Date of Birth: [birth year] Gender: Female Account #: [id number] Procedure:                Colonoscopy Indications:              Surveillance: Personal history of adenomatous                            polyps on last colonoscopy > 5 years ago Medicines:                Propofol per Anesthesia, Monitored Anesthesia Care Procedure:                Pre-Anesthesia Assessment:                           - Prior to the procedure, a History and Physical                            was performed, and patient medications and                            allergies were reviewed. The patient's tolerance of                            previous anesthesia was also reviewed. The risks                            and benefits of the procedure and the sedation                            options and risks were discussed with the patient.                            All questions were answered, and informed consent                            was obtained. Prior Anticoagulants: The patient has                            taken no previous anticoagulant or antiplatelet                            agents. ASA Grade Assessment: II - A patient with                            mild systemic disease. After reviewing the risks                            and benefits, the patient was deemed in                            satisfactory condition to undergo the procedure.  After obtaining informed consent, the colonoscope                            was passed under direct vision. Throughout the                            procedure, the patient's blood pressure, pulse, and                            oxygen saturations were monitored continuously. The                            Colonoscope was introduced through the anus and   advanced to the the cecum, identified by                            appendiceal orifice and ileocecal valve. The                            colonoscopy was somewhat difficult due to                            significant looping. Successful completion of the                            procedure was aided by applying abdominal pressure.                            The patient tolerated the procedure well. The                            quality of the bowel preparation was excellent. The                            ileocecal valve, appendiceal orifice, and rectum                            were photographed. The bowel preparation used was                            Miralax. Scope In: 9:56:56 AM Scope Out: 10:15:37 AM Scope Withdrawal Time: 0 hours 12 minutes 33 seconds  Total Procedure Duration: 0 hours 18 minutes 41 seconds  Findings:                 The perianal and digital rectal examinations were                            normal.                           Two sessile polyps were found in the descending                            colon and transverse colon. The polyps were 2 to 3  mm in size. These polyps were removed with a cold                            snare. Resection and retrieval were complete.                            Verification of patient identification for the                            specimen was done. Estimated blood loss was minimal.                           Multiple diverticula were found in the sigmoid                            colon.                           A diffuse area of melanotic mucosa was found in the                            entire colon.                           The exam was otherwise without abnormality on                            direct and retroflexion views. Complications:            No immediate complications. Estimated Blood Loss:     Estimated blood loss was minimal. Impression:               - Two 2 to 3 mm polyps  in the descending colon and                            in the transverse colon, removed with a cold snare.                            Resected and retrieved.                           - Diverticulosis in the sigmoid colon.                           - Melanotic mucosa in the entire examined colon.                           - The examination was otherwise normal on direct                            and retroflexion views.                           - Personal history of colonic polyp adenoma. Recommendation:           - Patient has a  contact number available for                            emergencies. The signs and symptoms of potential                            delayed complications were discussed with the                            patient. Return to normal activities tomorrow.                            Written discharge instructions were provided to the                            patient.                           - Resume previous diet.                           - Continue present medications.                           - Repeat colonoscopy is recommended for                            surveillance. The colonoscopy date will be                            determined after pathology results from today's                            exam become available for review. Gatha Mayer, MD 02/18/2018 10:25:48 AM This report has been signed electronically.

## 2018-02-18 NOTE — Progress Notes (Signed)
Pt's states no medical or surgical changes since previsit or office visit. 

## 2018-02-19 ENCOUNTER — Telehealth: Payer: Self-pay

## 2018-02-19 NOTE — Telephone Encounter (Signed)
  Follow up Call-  Call back number 02/18/2018  Post procedure Call Back phone  # [phone]  Permission to leave phone message Yes  Some recent data might be hidden     Patient questions:  Do you have a fever, pain , or abdominal swelling? No. Pain Score  0 *  Have you tolerated food without any problems? Yes.    Have you been able to return to your normal activities? Yes.    Do you have any questions about your discharge instructions: Diet   No. Medications  No. Follow up visit  No.  Do you have questions or concerns about your Care? No.  Actions: * If pain score is 4 or above: No action needed, pain <4.

## 2018-02-24 ENCOUNTER — Encounter: Payer: Self-pay | Admitting: Internal Medicine

## 2018-02-24 DIAGNOSIS — Z86010 Personal history of colonic polyps: Secondary | ICD-10-CM

## 2018-02-24 NOTE — Progress Notes (Signed)
2 adenomas recall 2025

## 2018-03-05 DIAGNOSIS — M7541 Impingement syndrome of right shoulder: Secondary | ICD-10-CM | POA: Diagnosis not present

## 2018-03-05 DIAGNOSIS — M25511 Pain in right shoulder: Secondary | ICD-10-CM | POA: Diagnosis not present

## 2018-03-05 DIAGNOSIS — M19011 Primary osteoarthritis, right shoulder: Secondary | ICD-10-CM | POA: Diagnosis not present

## 2018-03-12 DIAGNOSIS — M180 Bilateral primary osteoarthritis of first carpometacarpal joints: Secondary | ICD-10-CM | POA: Diagnosis not present

## 2018-03-12 DIAGNOSIS — R1900 Intra-abdominal and pelvic swelling, mass and lump, unspecified site: Secondary | ICD-10-CM | POA: Diagnosis not present

## 2018-03-12 DIAGNOSIS — M79641 Pain in right hand: Secondary | ICD-10-CM | POA: Diagnosis not present

## 2018-03-14 DIAGNOSIS — M25511 Pain in right shoulder: Secondary | ICD-10-CM | POA: Diagnosis not present

## 2018-04-10 DIAGNOSIS — I10 Essential (primary) hypertension: Secondary | ICD-10-CM | POA: Diagnosis not present

## 2018-04-10 DIAGNOSIS — E038 Other specified hypothyroidism: Secondary | ICD-10-CM | POA: Diagnosis not present

## 2018-04-10 DIAGNOSIS — R1084 Generalized abdominal pain: Secondary | ICD-10-CM | POA: Diagnosis not present

## 2018-04-10 DIAGNOSIS — E668 Other obesity: Secondary | ICD-10-CM | POA: Diagnosis not present

## 2018-10-07 DIAGNOSIS — R82998 Other abnormal findings in urine: Secondary | ICD-10-CM | POA: Diagnosis not present

## 2018-10-07 DIAGNOSIS — I10 Essential (primary) hypertension: Secondary | ICD-10-CM | POA: Diagnosis not present

## 2018-10-07 DIAGNOSIS — E038 Other specified hypothyroidism: Secondary | ICD-10-CM | POA: Diagnosis not present

## 2018-10-07 DIAGNOSIS — Z0000 Encounter for general adult medical examination without abnormal findings: Secondary | ICD-10-CM | POA: Diagnosis not present

## 2018-10-14 DIAGNOSIS — E785 Hyperlipidemia, unspecified: Secondary | ICD-10-CM | POA: Diagnosis not present

## 2018-10-14 DIAGNOSIS — E669 Obesity, unspecified: Secondary | ICD-10-CM | POA: Diagnosis not present

## 2018-10-14 DIAGNOSIS — Z1331 Encounter for screening for depression: Secondary | ICD-10-CM | POA: Diagnosis not present

## 2018-10-14 DIAGNOSIS — Z0000 Encounter for general adult medical examination without abnormal findings: Secondary | ICD-10-CM | POA: Diagnosis not present

## 2018-10-14 DIAGNOSIS — J309 Allergic rhinitis, unspecified: Secondary | ICD-10-CM | POA: Diagnosis not present

## 2018-10-14 DIAGNOSIS — R109 Unspecified abdominal pain: Secondary | ICD-10-CM | POA: Diagnosis not present

## 2018-10-22 DIAGNOSIS — Z1212 Encounter for screening for malignant neoplasm of rectum: Secondary | ICD-10-CM | POA: Diagnosis not present

## 2018-10-28 IMAGING — DX DG CHEST 2V
2 series · 2 of 2 positions shown · non-contrast
Comparison: None.

CLINICAL DATA: Mid chest discomfort.

EXAM:
CHEST  2 VIEW

[chest pa]
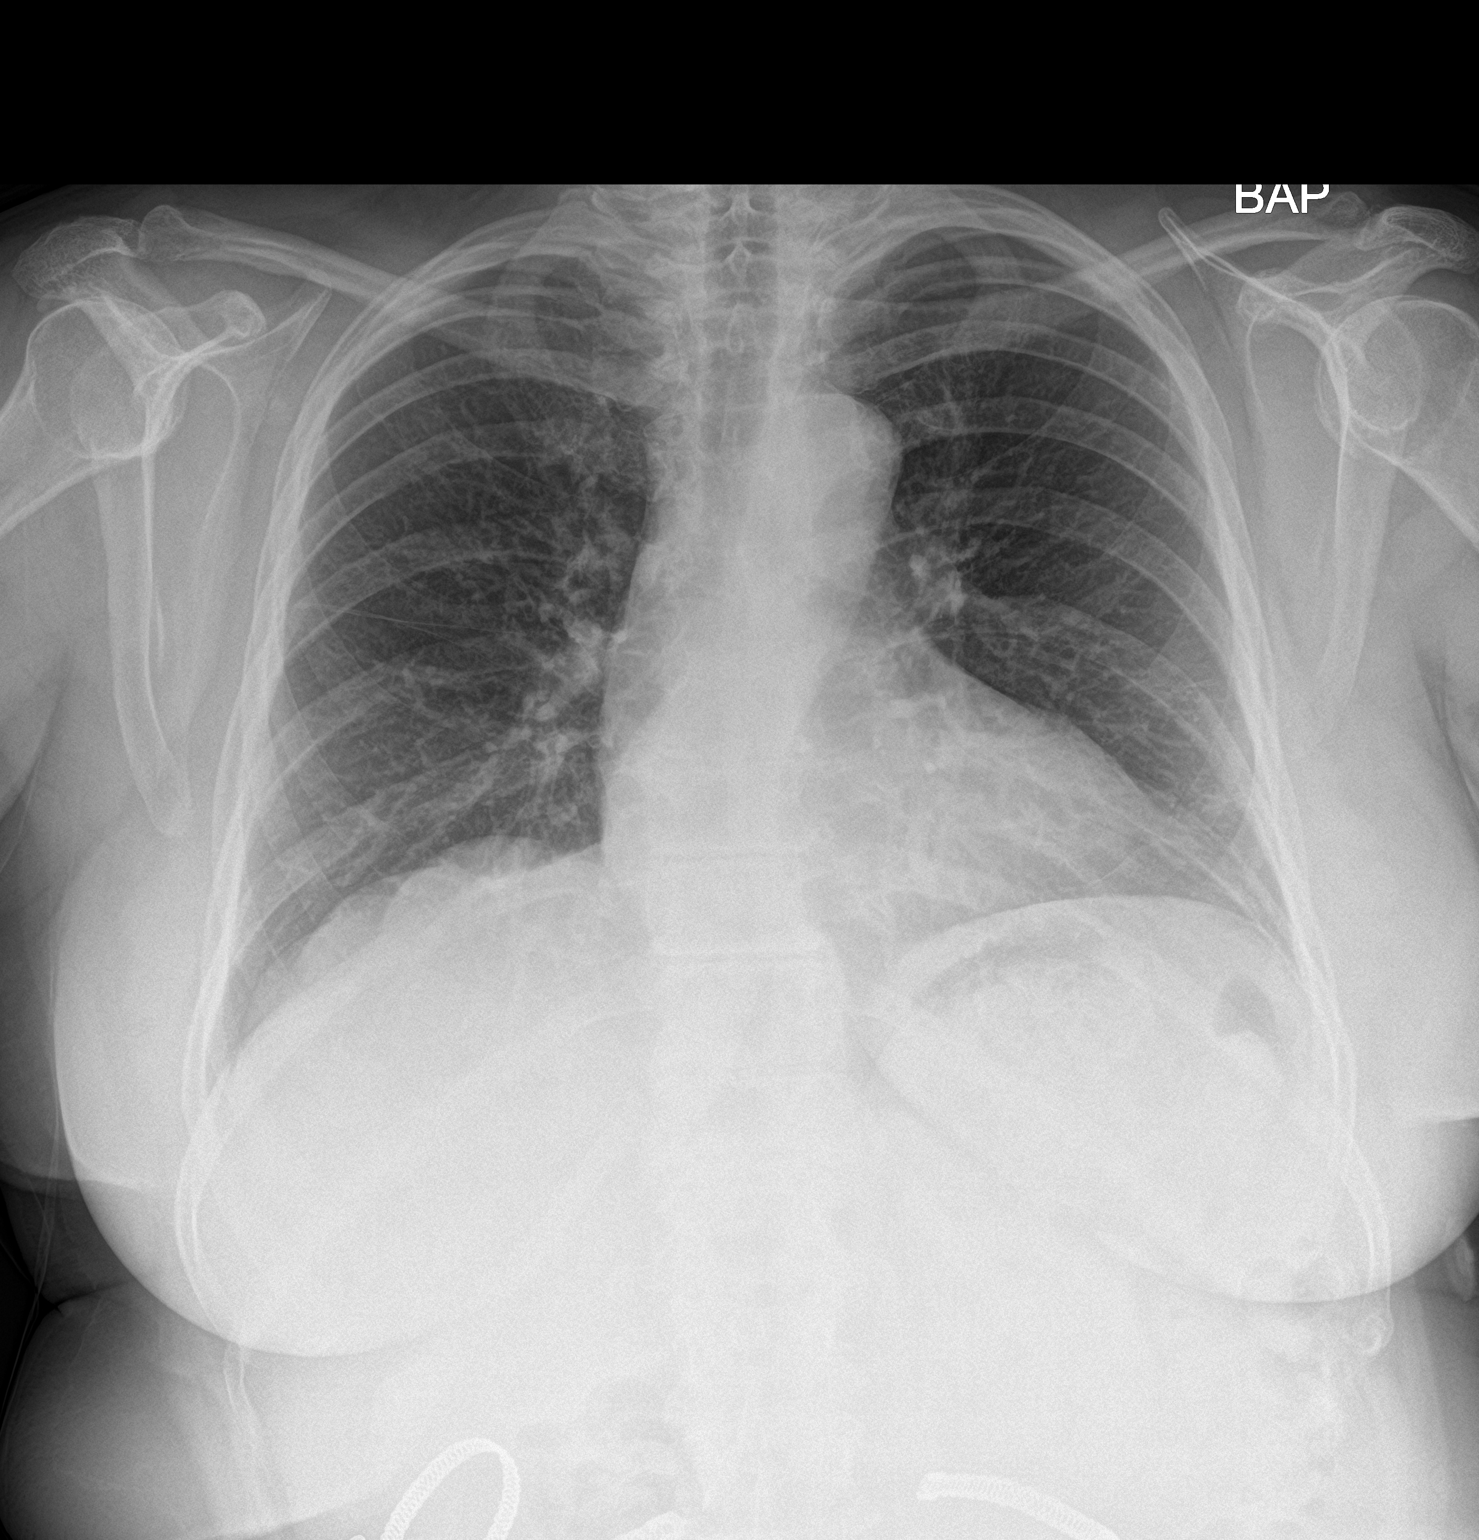

[chest lat]
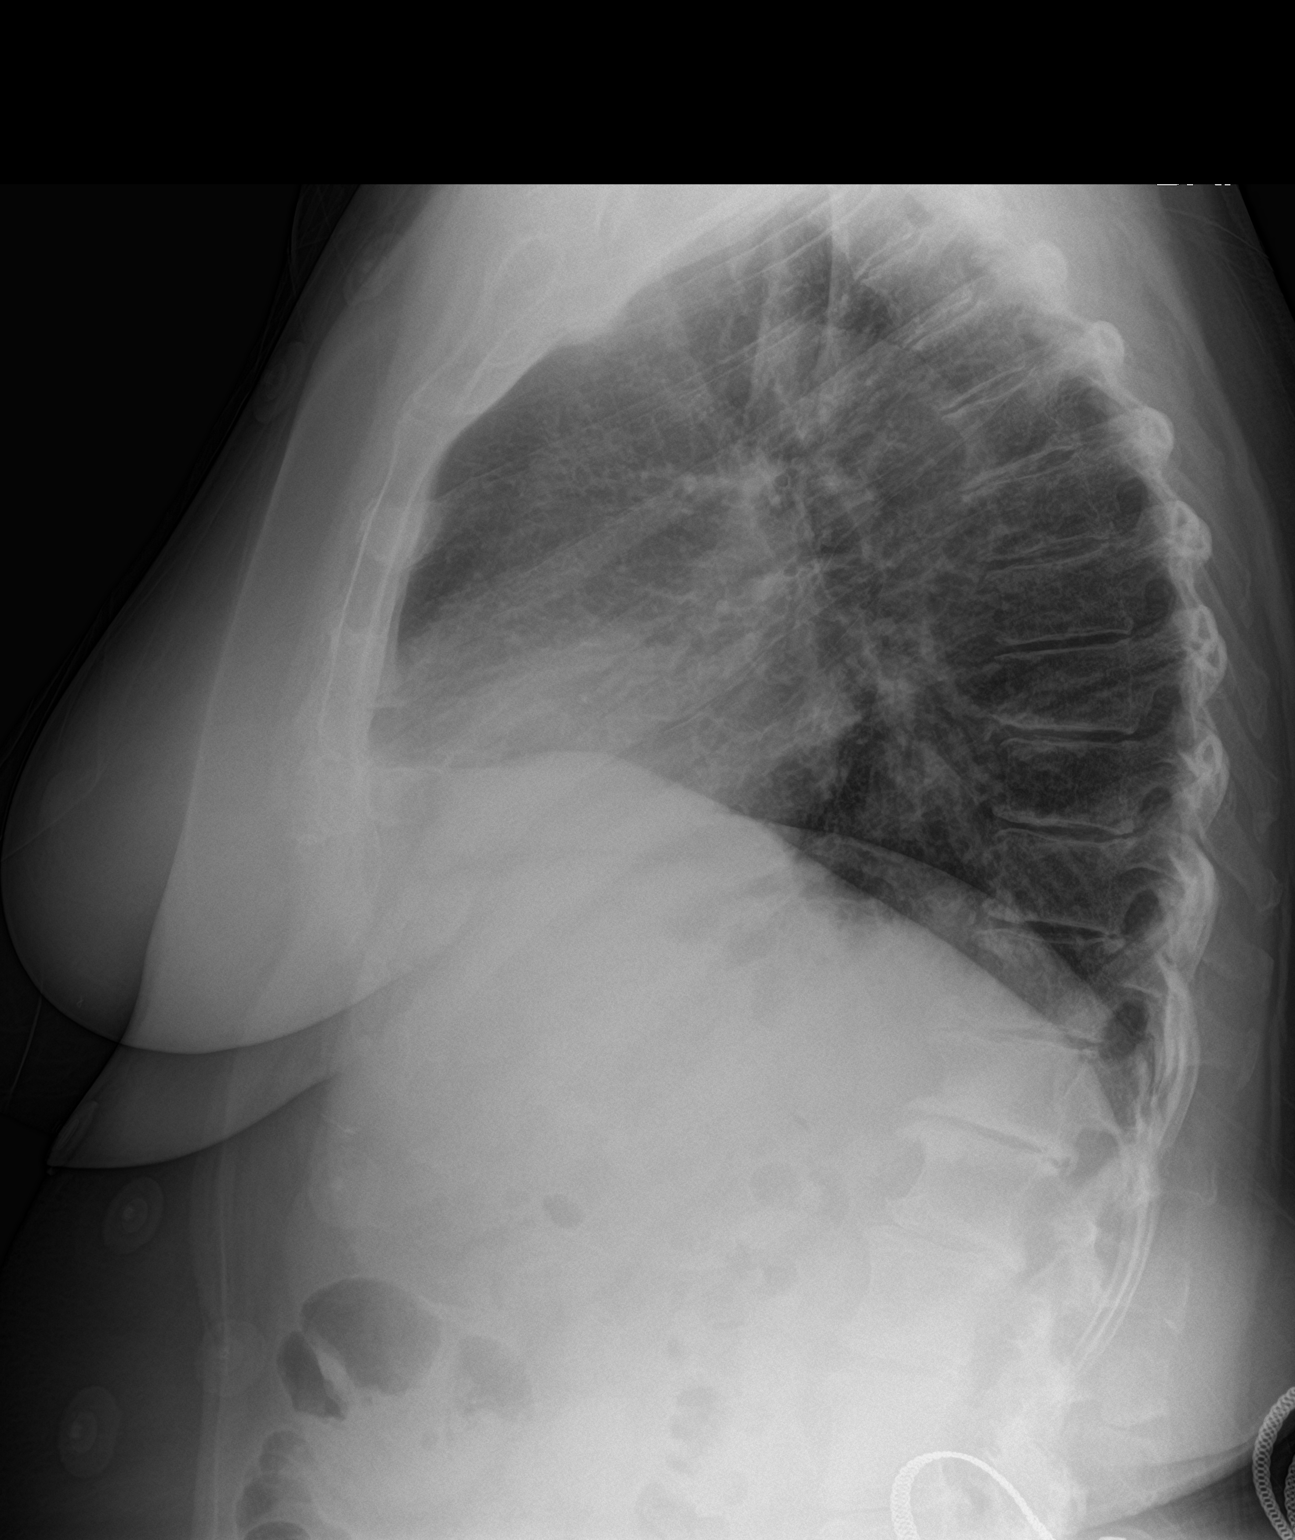

[2 of 2 positions shown; findings below may reference images not displayed]

FINDINGS: The heart size and mediastinal contours are within normal limits.
Tortuous thoracic aorta without aneurysm. Both lungs are clear. Mild
thoracic spondylosis with multilevel degenerative disc disease.
IMPRESSION: No active cardiopulmonary disease.  Mild thoracic spondylosis.

## 2019-01-10 DIAGNOSIS — R05 Cough: Secondary | ICD-10-CM | POA: Diagnosis not present

## 2019-01-10 DIAGNOSIS — Z20828 Contact with and (suspected) exposure to other viral communicable diseases: Secondary | ICD-10-CM | POA: Diagnosis not present

## 2019-01-10 DIAGNOSIS — R432 Parageusia: Secondary | ICD-10-CM | POA: Diagnosis not present

## 2019-02-05 DIAGNOSIS — L209 Atopic dermatitis, unspecified: Secondary | ICD-10-CM | POA: Diagnosis not present

## 2019-04-13 DIAGNOSIS — L209 Atopic dermatitis, unspecified: Secondary | ICD-10-CM | POA: Diagnosis not present

## 2019-04-13 DIAGNOSIS — E039 Hypothyroidism, unspecified: Secondary | ICD-10-CM | POA: Diagnosis not present

## 2019-04-13 DIAGNOSIS — I10 Essential (primary) hypertension: Secondary | ICD-10-CM | POA: Diagnosis not present

## 2019-04-13 DIAGNOSIS — E669 Obesity, unspecified: Secondary | ICD-10-CM | POA: Diagnosis not present

## 2019-04-14 DIAGNOSIS — E039 Hypothyroidism, unspecified: Secondary | ICD-10-CM | POA: Diagnosis not present

## 2019-09-08 DIAGNOSIS — H2513 Age-related nuclear cataract, bilateral: Secondary | ICD-10-CM | POA: Diagnosis not present

## 2019-09-28 DIAGNOSIS — M180 Bilateral primary osteoarthritis of first carpometacarpal joints: Secondary | ICD-10-CM | POA: Diagnosis not present

## 2019-09-28 DIAGNOSIS — M65342 Trigger finger, left ring finger: Secondary | ICD-10-CM | POA: Diagnosis not present

## 2019-09-28 DIAGNOSIS — M79642 Pain in left hand: Secondary | ICD-10-CM | POA: Diagnosis not present

## 2019-09-28 DIAGNOSIS — M79641 Pain in right hand: Secondary | ICD-10-CM | POA: Diagnosis not present

## 2019-10-13 DIAGNOSIS — E039 Hypothyroidism, unspecified: Secondary | ICD-10-CM | POA: Diagnosis not present

## 2019-10-13 DIAGNOSIS — E559 Vitamin D deficiency, unspecified: Secondary | ICD-10-CM | POA: Diagnosis not present

## 2019-10-13 DIAGNOSIS — E785 Hyperlipidemia, unspecified: Secondary | ICD-10-CM | POA: Diagnosis not present

## 2019-10-13 DIAGNOSIS — Z0000 Encounter for general adult medical examination without abnormal findings: Secondary | ICD-10-CM | POA: Diagnosis not present

## 2019-10-13 DIAGNOSIS — I10 Essential (primary) hypertension: Secondary | ICD-10-CM | POA: Diagnosis not present

## 2019-10-15 DIAGNOSIS — I10 Essential (primary) hypertension: Secondary | ICD-10-CM | POA: Diagnosis not present

## 2019-10-15 DIAGNOSIS — Z0000 Encounter for general adult medical examination without abnormal findings: Secondary | ICD-10-CM | POA: Diagnosis not present

## 2019-10-15 DIAGNOSIS — R82998 Other abnormal findings in urine: Secondary | ICD-10-CM | POA: Diagnosis not present

## 2019-10-15 DIAGNOSIS — Z1212 Encounter for screening for malignant neoplasm of rectum: Secondary | ICD-10-CM | POA: Diagnosis not present

## 2019-10-26 DIAGNOSIS — M65342 Trigger finger, left ring finger: Secondary | ICD-10-CM | POA: Diagnosis not present

## 2019-10-26 DIAGNOSIS — M1811 Unilateral primary osteoarthritis of first carpometacarpal joint, right hand: Secondary | ICD-10-CM | POA: Diagnosis not present

## 2019-10-26 DIAGNOSIS — M180 Bilateral primary osteoarthritis of first carpometacarpal joints: Secondary | ICD-10-CM | POA: Diagnosis not present

## 2019-12-10 DIAGNOSIS — S134XXA Sprain of ligaments of cervical spine, initial encounter: Secondary | ICD-10-CM | POA: Diagnosis not present

## 2019-12-10 DIAGNOSIS — S338XXA Sprain of other parts of lumbar spine and pelvis, initial encounter: Secondary | ICD-10-CM | POA: Diagnosis not present

## 2019-12-10 DIAGNOSIS — S233XXA Sprain of ligaments of thoracic spine, initial encounter: Secondary | ICD-10-CM | POA: Diagnosis not present

## 2019-12-28 DIAGNOSIS — M79644 Pain in right finger(s): Secondary | ICD-10-CM | POA: Diagnosis not present

## 2019-12-28 DIAGNOSIS — S62174A Nondisplaced fracture of trapezium [larger multangular], right wrist, initial encounter for closed fracture: Secondary | ICD-10-CM | POA: Diagnosis not present

## 2019-12-28 DIAGNOSIS — M79645 Pain in left finger(s): Secondary | ICD-10-CM | POA: Diagnosis not present

## 2020-01-07 DIAGNOSIS — M13841 Other specified arthritis, right hand: Secondary | ICD-10-CM | POA: Diagnosis not present

## 2020-01-07 DIAGNOSIS — M65342 Trigger finger, left ring finger: Secondary | ICD-10-CM | POA: Diagnosis not present

## 2020-02-19 DIAGNOSIS — M65342 Trigger finger, left ring finger: Secondary | ICD-10-CM | POA: Diagnosis not present

## 2020-02-19 DIAGNOSIS — M1812 Unilateral primary osteoarthritis of first carpometacarpal joint, left hand: Secondary | ICD-10-CM | POA: Diagnosis not present

## 2020-02-19 DIAGNOSIS — M7551 Bursitis of right shoulder: Secondary | ICD-10-CM | POA: Diagnosis not present

## 2020-03-03 DIAGNOSIS — M1812 Unilateral primary osteoarthritis of first carpometacarpal joint, left hand: Secondary | ICD-10-CM | POA: Diagnosis not present

## 2020-03-17 DIAGNOSIS — M1812 Unilateral primary osteoarthritis of first carpometacarpal joint, left hand: Secondary | ICD-10-CM | POA: Diagnosis not present

## 2020-03-17 DIAGNOSIS — Z4789 Encounter for other orthopedic aftercare: Secondary | ICD-10-CM | POA: Diagnosis not present

## 2020-03-17 DIAGNOSIS — M1811 Unilateral primary osteoarthritis of first carpometacarpal joint, right hand: Secondary | ICD-10-CM | POA: Diagnosis not present

## 2020-03-31 DIAGNOSIS — M79642 Pain in left hand: Secondary | ICD-10-CM | POA: Diagnosis not present

## 2020-03-31 DIAGNOSIS — M1812 Unilateral primary osteoarthritis of first carpometacarpal joint, left hand: Secondary | ICD-10-CM | POA: Diagnosis not present

## 2020-04-07 DIAGNOSIS — M79642 Pain in left hand: Secondary | ICD-10-CM | POA: Diagnosis not present

## 2020-04-14 DIAGNOSIS — M79642 Pain in left hand: Secondary | ICD-10-CM | POA: Diagnosis not present

## 2020-04-21 ENCOUNTER — Other Ambulatory Visit: Payer: Self-pay | Admitting: Obstetrics and Gynecology

## 2020-04-21 DIAGNOSIS — M79642 Pain in left hand: Secondary | ICD-10-CM | POA: Diagnosis not present

## 2020-04-21 DIAGNOSIS — Z1231 Encounter for screening mammogram for malignant neoplasm of breast: Secondary | ICD-10-CM

## 2020-04-28 DIAGNOSIS — M1812 Unilateral primary osteoarthritis of first carpometacarpal joint, left hand: Secondary | ICD-10-CM | POA: Diagnosis not present

## 2020-04-28 DIAGNOSIS — M79642 Pain in left hand: Secondary | ICD-10-CM | POA: Diagnosis not present

## 2020-05-30 DIAGNOSIS — Z01419 Encounter for gynecological examination (general) (routine) without abnormal findings: Secondary | ICD-10-CM | POA: Diagnosis not present

## 2020-05-30 DIAGNOSIS — Z1389 Encounter for screening for other disorder: Secondary | ICD-10-CM | POA: Diagnosis not present

## 2020-05-30 DIAGNOSIS — Z130 Encounter for screening for diseases of the blood and blood-forming organs and certain disorders involving the immune mechanism: Secondary | ICD-10-CM | POA: Diagnosis not present

## 2020-05-30 DIAGNOSIS — Z6833 Body mass index (BMI) 33.0-33.9, adult: Secondary | ICD-10-CM | POA: Diagnosis not present

## 2020-05-31 DIAGNOSIS — Z124 Encounter for screening for malignant neoplasm of cervix: Secondary | ICD-10-CM | POA: Diagnosis not present

## 2020-06-07 DIAGNOSIS — M7542 Impingement syndrome of left shoulder: Secondary | ICD-10-CM | POA: Diagnosis not present

## 2020-06-07 DIAGNOSIS — M1811 Unilateral primary osteoarthritis of first carpometacarpal joint, right hand: Secondary | ICD-10-CM | POA: Diagnosis not present

## 2020-06-07 DIAGNOSIS — G8918 Other acute postprocedural pain: Secondary | ICD-10-CM | POA: Diagnosis not present

## 2020-06-07 DIAGNOSIS — M13131 Monoarthritis, not elsewhere classified, right wrist: Secondary | ICD-10-CM | POA: Diagnosis not present

## 2020-06-14 ENCOUNTER — Ambulatory Visit: Payer: BLUE CROSS/BLUE SHIELD

## 2020-06-22 DIAGNOSIS — M1811 Unilateral primary osteoarthritis of first carpometacarpal joint, right hand: Secondary | ICD-10-CM | POA: Diagnosis not present

## 2020-07-05 ENCOUNTER — Ambulatory Visit: Payer: BLUE CROSS/BLUE SHIELD

## 2020-07-06 ENCOUNTER — Other Ambulatory Visit: Payer: Self-pay

## 2020-07-06 ENCOUNTER — Ambulatory Visit
Admission: RE | Admit: 2020-07-06 | Discharge: 2020-07-06 | Disposition: A | Discharge status: Home / Self Care | Payer: BC Managed Care – PPO | Source: Ambulatory Visit | Attending: Obstetrics and Gynecology | Admitting: Obstetrics and Gynecology

## 2020-07-06 DIAGNOSIS — Z1231 Encounter for screening mammogram for malignant neoplasm of breast: Secondary | ICD-10-CM | POA: Diagnosis not present

## 2020-07-06 DIAGNOSIS — M1811 Unilateral primary osteoarthritis of first carpometacarpal joint, right hand: Secondary | ICD-10-CM | POA: Diagnosis not present

## 2020-07-07 ENCOUNTER — Ambulatory Visit: Payer: BLUE CROSS/BLUE SHIELD

## 2020-07-20 DIAGNOSIS — Z4789 Encounter for other orthopedic aftercare: Secondary | ICD-10-CM | POA: Diagnosis not present

## 2020-07-20 DIAGNOSIS — M79644 Pain in right finger(s): Secondary | ICD-10-CM | POA: Diagnosis not present

## 2020-08-04 DIAGNOSIS — M79644 Pain in right finger(s): Secondary | ICD-10-CM | POA: Diagnosis not present

## 2020-08-15 DIAGNOSIS — M79644 Pain in right finger(s): Secondary | ICD-10-CM | POA: Diagnosis not present

## 2020-08-31 DIAGNOSIS — M79644 Pain in right finger(s): Secondary | ICD-10-CM | POA: Diagnosis not present

## 2020-08-31 DIAGNOSIS — M1811 Unilateral primary osteoarthritis of first carpometacarpal joint, right hand: Secondary | ICD-10-CM | POA: Diagnosis not present

## 2020-09-05 DIAGNOSIS — M2021 Hallux rigidus, right foot: Secondary | ICD-10-CM | POA: Diagnosis not present

## 2020-10-14 DIAGNOSIS — E039 Hypothyroidism, unspecified: Secondary | ICD-10-CM | POA: Diagnosis not present

## 2020-10-14 DIAGNOSIS — E559 Vitamin D deficiency, unspecified: Secondary | ICD-10-CM | POA: Diagnosis not present

## 2020-10-14 DIAGNOSIS — E785 Hyperlipidemia, unspecified: Secondary | ICD-10-CM | POA: Diagnosis not present

## 2020-10-17 DIAGNOSIS — Z0000 Encounter for general adult medical examination without abnormal findings: Secondary | ICD-10-CM | POA: Diagnosis not present

## 2020-10-17 DIAGNOSIS — I10 Essential (primary) hypertension: Secondary | ICD-10-CM | POA: Diagnosis not present

## 2020-10-17 DIAGNOSIS — Z1339 Encounter for screening examination for other mental health and behavioral disorders: Secondary | ICD-10-CM | POA: Diagnosis not present

## 2020-10-17 DIAGNOSIS — R82998 Other abnormal findings in urine: Secondary | ICD-10-CM | POA: Diagnosis not present

## 2020-10-17 DIAGNOSIS — Z1331 Encounter for screening for depression: Secondary | ICD-10-CM | POA: Diagnosis not present

## 2020-10-19 DIAGNOSIS — Z1212 Encounter for screening for malignant neoplasm of rectum: Secondary | ICD-10-CM | POA: Diagnosis not present

## 2020-11-15 DIAGNOSIS — M21611 Bunion of right foot: Secondary | ICD-10-CM | POA: Diagnosis not present

## 2020-11-15 DIAGNOSIS — M2011 Hallux valgus (acquired), right foot: Secondary | ICD-10-CM | POA: Diagnosis not present

## 2020-11-28 DIAGNOSIS — M2021 Hallux rigidus, right foot: Secondary | ICD-10-CM | POA: Diagnosis not present

## 2020-12-02 DIAGNOSIS — N393 Stress incontinence (female) (male): Secondary | ICD-10-CM | POA: Diagnosis not present

## 2020-12-02 DIAGNOSIS — N200 Calculus of kidney: Secondary | ICD-10-CM | POA: Diagnosis not present

## 2020-12-20 DIAGNOSIS — M6289 Other specified disorders of muscle: Secondary | ICD-10-CM | POA: Diagnosis not present

## 2020-12-20 DIAGNOSIS — N393 Stress incontinence (female) (male): Secondary | ICD-10-CM | POA: Diagnosis not present

## 2020-12-20 DIAGNOSIS — M62838 Other muscle spasm: Secondary | ICD-10-CM | POA: Diagnosis not present

## 2020-12-20 DIAGNOSIS — M6281 Muscle weakness (generalized): Secondary | ICD-10-CM | POA: Diagnosis not present

## 2021-01-04 DIAGNOSIS — M6289 Other specified disorders of muscle: Secondary | ICD-10-CM | POA: Diagnosis not present

## 2021-01-04 DIAGNOSIS — M6281 Muscle weakness (generalized): Secondary | ICD-10-CM | POA: Diagnosis not present

## 2021-01-04 DIAGNOSIS — N393 Stress incontinence (female) (male): Secondary | ICD-10-CM | POA: Diagnosis not present

## 2021-01-12 DIAGNOSIS — H40023 Open angle with borderline findings, high risk, bilateral: Secondary | ICD-10-CM | POA: Diagnosis not present

## 2021-04-17 DIAGNOSIS — S233XXA Sprain of ligaments of thoracic spine, initial encounter: Secondary | ICD-10-CM | POA: Diagnosis not present

## 2021-04-17 DIAGNOSIS — S338XXA Sprain of other parts of lumbar spine and pelvis, initial encounter: Secondary | ICD-10-CM | POA: Diagnosis not present

## 2021-04-17 DIAGNOSIS — S134XXA Sprain of ligaments of cervical spine, initial encounter: Secondary | ICD-10-CM | POA: Diagnosis not present

## 2021-05-15 DIAGNOSIS — M79672 Pain in left foot: Secondary | ICD-10-CM | POA: Diagnosis not present

## 2021-05-15 DIAGNOSIS — M10072 Idiopathic gout, left ankle and foot: Secondary | ICD-10-CM | POA: Diagnosis not present

## 2021-05-19 DIAGNOSIS — M25562 Pain in left knee: Secondary | ICD-10-CM | POA: Diagnosis not present

## 2021-05-27 DIAGNOSIS — M25562 Pain in left knee: Secondary | ICD-10-CM | POA: Diagnosis not present

## 2021-06-01 DIAGNOSIS — S83242A Other tear of medial meniscus, current injury, left knee, initial encounter: Secondary | ICD-10-CM | POA: Diagnosis not present

## 2021-06-01 DIAGNOSIS — M1712 Unilateral primary osteoarthritis, left knee: Secondary | ICD-10-CM | POA: Diagnosis not present

## 2021-06-01 DIAGNOSIS — M25562 Pain in left knee: Secondary | ICD-10-CM | POA: Diagnosis not present

## 2021-07-11 DIAGNOSIS — S338XXA Sprain of other parts of lumbar spine and pelvis, initial encounter: Secondary | ICD-10-CM | POA: Diagnosis not present

## 2021-07-11 DIAGNOSIS — S233XXA Sprain of ligaments of thoracic spine, initial encounter: Secondary | ICD-10-CM | POA: Diagnosis not present

## 2021-07-11 DIAGNOSIS — S134XXA Sprain of ligaments of cervical spine, initial encounter: Secondary | ICD-10-CM | POA: Diagnosis not present

## 2021-09-27 DIAGNOSIS — M1712 Unilateral primary osteoarthritis, left knee: Secondary | ICD-10-CM | POA: Diagnosis not present

## 2021-09-27 DIAGNOSIS — M25562 Pain in left knee: Secondary | ICD-10-CM | POA: Diagnosis not present

## 2021-10-19 DIAGNOSIS — E785 Hyperlipidemia, unspecified: Secondary | ICD-10-CM | POA: Diagnosis not present

## 2021-10-19 DIAGNOSIS — E559 Vitamin D deficiency, unspecified: Secondary | ICD-10-CM | POA: Diagnosis not present

## 2021-10-19 DIAGNOSIS — D509 Iron deficiency anemia, unspecified: Secondary | ICD-10-CM | POA: Diagnosis not present

## 2021-10-19 DIAGNOSIS — E039 Hypothyroidism, unspecified: Secondary | ICD-10-CM | POA: Diagnosis not present

## 2021-10-26 DIAGNOSIS — R82998 Other abnormal findings in urine: Secondary | ICD-10-CM | POA: Diagnosis not present

## 2021-10-26 DIAGNOSIS — I10 Essential (primary) hypertension: Secondary | ICD-10-CM | POA: Diagnosis not present

## 2021-10-26 DIAGNOSIS — Z1331 Encounter for screening for depression: Secondary | ICD-10-CM | POA: Diagnosis not present

## 2021-10-26 DIAGNOSIS — Z1339 Encounter for screening examination for other mental health and behavioral disorders: Secondary | ICD-10-CM | POA: Diagnosis not present

## 2021-10-26 DIAGNOSIS — Z0000 Encounter for general adult medical examination without abnormal findings: Secondary | ICD-10-CM | POA: Diagnosis not present

## 2021-10-30 ENCOUNTER — Other Ambulatory Visit: Payer: Self-pay | Admitting: Internal Medicine

## 2021-10-30 DIAGNOSIS — E785 Hyperlipidemia, unspecified: Secondary | ICD-10-CM

## 2021-11-08 ENCOUNTER — Other Ambulatory Visit: Payer: Self-pay | Admitting: Internal Medicine

## 2021-11-08 DIAGNOSIS — Z1231 Encounter for screening mammogram for malignant neoplasm of breast: Secondary | ICD-10-CM

## 2021-11-09 ENCOUNTER — Ambulatory Visit
Admission: RE | Admit: 2021-11-09 | Discharge: 2021-11-09 | Disposition: A | Discharge status: Home / Self Care | Payer: BC Managed Care – PPO | Source: Ambulatory Visit | Attending: Internal Medicine | Admitting: Internal Medicine

## 2021-11-09 DIAGNOSIS — E785 Hyperlipidemia, unspecified: Secondary | ICD-10-CM

## 2021-11-17 ENCOUNTER — Ambulatory Visit
Admission: RE | Admit: 2021-11-17 | Discharge: 2021-11-17 | Disposition: A | Discharge status: Home / Self Care | Payer: BC Managed Care – PPO | Source: Ambulatory Visit | Attending: Internal Medicine | Admitting: Internal Medicine

## 2021-11-17 DIAGNOSIS — Z1389 Encounter for screening for other disorder: Secondary | ICD-10-CM | POA: Diagnosis not present

## 2021-11-17 DIAGNOSIS — Z01419 Encounter for gynecological examination (general) (routine) without abnormal findings: Secondary | ICD-10-CM | POA: Diagnosis not present

## 2021-11-17 DIAGNOSIS — N951 Menopausal and female climacteric states: Secondary | ICD-10-CM | POA: Diagnosis not present

## 2021-11-17 DIAGNOSIS — N898 Other specified noninflammatory disorders of vagina: Secondary | ICD-10-CM | POA: Diagnosis not present

## 2021-11-17 DIAGNOSIS — Z1231 Encounter for screening mammogram for malignant neoplasm of breast: Secondary | ICD-10-CM

## 2021-11-21 DIAGNOSIS — N950 Postmenopausal bleeding: Secondary | ICD-10-CM | POA: Diagnosis not present

## 2021-11-23 DIAGNOSIS — M1712 Unilateral primary osteoarthritis, left knee: Secondary | ICD-10-CM | POA: Diagnosis not present

## 2021-11-30 DIAGNOSIS — M1712 Unilateral primary osteoarthritis, left knee: Secondary | ICD-10-CM | POA: Diagnosis not present

## 2021-11-30 DIAGNOSIS — N950 Postmenopausal bleeding: Secondary | ICD-10-CM | POA: Diagnosis not present

## 2021-12-07 DIAGNOSIS — S233XXA Sprain of ligaments of thoracic spine, initial encounter: Secondary | ICD-10-CM | POA: Diagnosis not present

## 2021-12-07 DIAGNOSIS — S134XXA Sprain of ligaments of cervical spine, initial encounter: Secondary | ICD-10-CM | POA: Diagnosis not present

## 2021-12-07 DIAGNOSIS — S338XXA Sprain of other parts of lumbar spine and pelvis, initial encounter: Secondary | ICD-10-CM | POA: Diagnosis not present

## 2021-12-26 DIAGNOSIS — R062 Wheezing: Secondary | ICD-10-CM | POA: Diagnosis not present

## 2021-12-26 DIAGNOSIS — I10 Essential (primary) hypertension: Secondary | ICD-10-CM | POA: Diagnosis not present

## 2021-12-26 DIAGNOSIS — R051 Acute cough: Secondary | ICD-10-CM | POA: Diagnosis not present

## 2022-01-17 DIAGNOSIS — M1712 Unilateral primary osteoarthritis, left knee: Secondary | ICD-10-CM | POA: Diagnosis not present

## 2022-01-22 DIAGNOSIS — S338XXA Sprain of other parts of lumbar spine and pelvis, initial encounter: Secondary | ICD-10-CM | POA: Diagnosis not present

## 2022-01-22 DIAGNOSIS — S134XXA Sprain of ligaments of cervical spine, initial encounter: Secondary | ICD-10-CM | POA: Diagnosis not present

## 2022-01-22 DIAGNOSIS — S233XXA Sprain of ligaments of thoracic spine, initial encounter: Secondary | ICD-10-CM | POA: Diagnosis not present

## 2022-01-31 DIAGNOSIS — S134XXA Sprain of ligaments of cervical spine, initial encounter: Secondary | ICD-10-CM | POA: Diagnosis not present

## 2022-01-31 DIAGNOSIS — S73102A Unspecified sprain of left hip, initial encounter: Secondary | ICD-10-CM | POA: Diagnosis not present

## 2022-01-31 DIAGNOSIS — S233XXA Sprain of ligaments of thoracic spine, initial encounter: Secondary | ICD-10-CM | POA: Diagnosis not present

## 2022-01-31 DIAGNOSIS — S338XXA Sprain of other parts of lumbar spine and pelvis, initial encounter: Secondary | ICD-10-CM | POA: Diagnosis not present

## 2022-03-09 DIAGNOSIS — E785 Hyperlipidemia, unspecified: Secondary | ICD-10-CM | POA: Diagnosis not present

## 2022-03-09 DIAGNOSIS — M25562 Pain in left knee: Secondary | ICD-10-CM | POA: Diagnosis not present

## 2022-03-09 DIAGNOSIS — I10 Essential (primary) hypertension: Secondary | ICD-10-CM | POA: Diagnosis not present

## 2022-03-26 DIAGNOSIS — M1712 Unilateral primary osteoarthritis, left knee: Secondary | ICD-10-CM | POA: Diagnosis not present

## 2022-04-23 DIAGNOSIS — H40013 Open angle with borderline findings, low risk, bilateral: Secondary | ICD-10-CM | POA: Diagnosis not present

## 2022-04-23 DIAGNOSIS — H524 Presbyopia: Secondary | ICD-10-CM | POA: Diagnosis not present

## 2022-11-06 DIAGNOSIS — E039 Hypothyroidism, unspecified: Secondary | ICD-10-CM | POA: Diagnosis not present

## 2022-11-06 DIAGNOSIS — Z1212 Encounter for screening for malignant neoplasm of rectum: Secondary | ICD-10-CM | POA: Diagnosis not present

## 2022-11-06 DIAGNOSIS — I10 Essential (primary) hypertension: Secondary | ICD-10-CM | POA: Diagnosis not present

## 2022-11-06 DIAGNOSIS — E785 Hyperlipidemia, unspecified: Secondary | ICD-10-CM | POA: Diagnosis not present

## 2022-11-06 DIAGNOSIS — E611 Iron deficiency: Secondary | ICD-10-CM | POA: Diagnosis not present

## 2022-11-06 DIAGNOSIS — R748 Abnormal levels of other serum enzymes: Secondary | ICD-10-CM | POA: Diagnosis not present

## 2022-11-08 DIAGNOSIS — E669 Obesity, unspecified: Secondary | ICD-10-CM | POA: Diagnosis not present

## 2022-11-08 DIAGNOSIS — Z8616 Personal history of COVID-19: Secondary | ICD-10-CM | POA: Diagnosis not present

## 2022-11-08 DIAGNOSIS — Z6833 Body mass index (BMI) 33.0-33.9, adult: Secondary | ICD-10-CM | POA: Diagnosis not present

## 2022-11-08 DIAGNOSIS — Z79890 Hormone replacement therapy (postmenopausal): Secondary | ICD-10-CM | POA: Diagnosis not present

## 2022-11-08 DIAGNOSIS — Z833 Family history of diabetes mellitus: Secondary | ICD-10-CM | POA: Diagnosis not present

## 2022-11-08 DIAGNOSIS — Z8249 Family history of ischemic heart disease and other diseases of the circulatory system: Secondary | ICD-10-CM | POA: Diagnosis not present

## 2022-11-08 DIAGNOSIS — E785 Hyperlipidemia, unspecified: Secondary | ICD-10-CM | POA: Diagnosis not present

## 2022-11-08 DIAGNOSIS — Z91013 Allergy to seafood: Secondary | ICD-10-CM | POA: Diagnosis not present

## 2022-11-08 DIAGNOSIS — E039 Hypothyroidism, unspecified: Secondary | ICD-10-CM | POA: Diagnosis not present

## 2022-11-08 DIAGNOSIS — K5900 Constipation, unspecified: Secondary | ICD-10-CM | POA: Diagnosis not present

## 2022-11-08 DIAGNOSIS — R32 Unspecified urinary incontinence: Secondary | ICD-10-CM | POA: Diagnosis not present

## 2022-11-08 DIAGNOSIS — I10 Essential (primary) hypertension: Secondary | ICD-10-CM | POA: Diagnosis not present

## 2022-11-13 DIAGNOSIS — Z1331 Encounter for screening for depression: Secondary | ICD-10-CM | POA: Diagnosis not present

## 2022-11-13 DIAGNOSIS — E039 Hypothyroidism, unspecified: Secondary | ICD-10-CM | POA: Diagnosis not present

## 2022-11-13 DIAGNOSIS — I10 Essential (primary) hypertension: Secondary | ICD-10-CM | POA: Diagnosis not present

## 2022-11-13 DIAGNOSIS — E785 Hyperlipidemia, unspecified: Secondary | ICD-10-CM | POA: Diagnosis not present

## 2022-11-13 DIAGNOSIS — E669 Obesity, unspecified: Secondary | ICD-10-CM | POA: Diagnosis not present

## 2022-11-13 DIAGNOSIS — M1990 Unspecified osteoarthritis, unspecified site: Secondary | ICD-10-CM | POA: Diagnosis not present

## 2022-11-13 DIAGNOSIS — R748 Abnormal levels of other serum enzymes: Secondary | ICD-10-CM | POA: Diagnosis not present

## 2022-11-13 DIAGNOSIS — Z1212 Encounter for screening for malignant neoplasm of rectum: Secondary | ICD-10-CM | POA: Diagnosis not present

## 2022-11-13 DIAGNOSIS — R82998 Other abnormal findings in urine: Secondary | ICD-10-CM | POA: Diagnosis not present

## 2022-11-13 DIAGNOSIS — G4700 Insomnia, unspecified: Secondary | ICD-10-CM | POA: Diagnosis not present

## 2022-11-13 DIAGNOSIS — I2510 Atherosclerotic heart disease of native coronary artery without angina pectoris: Secondary | ICD-10-CM | POA: Diagnosis not present

## 2022-11-13 DIAGNOSIS — E611 Iron deficiency: Secondary | ICD-10-CM | POA: Diagnosis not present

## 2022-11-13 DIAGNOSIS — N393 Stress incontinence (female) (male): Secondary | ICD-10-CM | POA: Diagnosis not present

## 2022-11-13 DIAGNOSIS — Z0000 Encounter for general adult medical examination without abnormal findings: Secondary | ICD-10-CM | POA: Diagnosis not present

## 2022-11-22 ENCOUNTER — Encounter: Payer: Self-pay | Admitting: Physician Assistant

## 2023-01-10 DIAGNOSIS — H3322 Serous retinal detachment, left eye: Secondary | ICD-10-CM | POA: Diagnosis not present

## 2023-01-10 DIAGNOSIS — H33002 Unspecified retinal detachment with retinal break, left eye: Secondary | ICD-10-CM | POA: Diagnosis not present

## 2023-01-11 DIAGNOSIS — H33002 Unspecified retinal detachment with retinal break, left eye: Secondary | ICD-10-CM | POA: Diagnosis not present

## 2023-01-11 DIAGNOSIS — H33022 Retinal detachment with multiple breaks, left eye: Secondary | ICD-10-CM | POA: Diagnosis not present

## 2023-01-11 HISTORY — PX: RETINAL DETACHMENT SURGERY: SHX105

## 2023-01-12 DIAGNOSIS — H3322 Serous retinal detachment, left eye: Secondary | ICD-10-CM | POA: Diagnosis not present

## 2023-02-04 DIAGNOSIS — S134XXA Sprain of ligaments of cervical spine, initial encounter: Secondary | ICD-10-CM | POA: Diagnosis not present

## 2023-02-04 DIAGNOSIS — S73102A Unspecified sprain of left hip, initial encounter: Secondary | ICD-10-CM | POA: Diagnosis not present

## 2023-02-04 DIAGNOSIS — S338XXA Sprain of other parts of lumbar spine and pelvis, initial encounter: Secondary | ICD-10-CM | POA: Diagnosis not present

## 2023-02-04 DIAGNOSIS — S233XXA Sprain of ligaments of thoracic spine, initial encounter: Secondary | ICD-10-CM | POA: Diagnosis not present

## 2023-02-12 DIAGNOSIS — H3322 Serous retinal detachment, left eye: Secondary | ICD-10-CM | POA: Diagnosis not present

## 2023-02-12 DIAGNOSIS — H25813 Combined forms of age-related cataract, bilateral: Secondary | ICD-10-CM | POA: Diagnosis not present

## 2023-02-22 ENCOUNTER — Ambulatory Visit (INDEPENDENT_AMBULATORY_CARE_PROVIDER_SITE_OTHER): Payer: 59 | Admitting: Physician Assistant

## 2023-02-22 ENCOUNTER — Other Ambulatory Visit (INDEPENDENT_AMBULATORY_CARE_PROVIDER_SITE_OTHER): Payer: 59

## 2023-02-22 ENCOUNTER — Encounter: Payer: Self-pay | Admitting: Physician Assistant

## 2023-02-22 VITALS — BP 110/80 | HR 60 | Ht 60.0 in | Wt 181.4 lb

## 2023-02-22 DIAGNOSIS — R195 Other fecal abnormalities: Secondary | ICD-10-CM | POA: Diagnosis not present

## 2023-02-22 DIAGNOSIS — K5730 Diverticulosis of large intestine without perforation or abscess without bleeding: Secondary | ICD-10-CM | POA: Diagnosis not present

## 2023-02-22 DIAGNOSIS — K649 Unspecified hemorrhoids: Secondary | ICD-10-CM

## 2023-02-22 DIAGNOSIS — Z8669 Personal history of other diseases of the nervous system and sense organs: Secondary | ICD-10-CM

## 2023-02-22 DIAGNOSIS — Z860101 Personal history of adenomatous and serrated colon polyps: Secondary | ICD-10-CM | POA: Diagnosis not present

## 2023-02-22 DIAGNOSIS — K5900 Constipation, unspecified: Secondary | ICD-10-CM

## 2023-02-22 LAB — CBC WITH DIFFERENTIAL/PLATELET
Basophils Absolute: 0 10*3/uL (ref 0.0–0.1)
Basophils Relative: 0.6 % (ref 0.0–3.0)
Eosinophils Absolute: 0.1 10*3/uL (ref 0.0–0.7)
Eosinophils Relative: 1.8 % (ref 0.0–5.0)
HCT: 42.5 % (ref 36.0–46.0)
Hemoglobin: 14.1 g/dL (ref 12.0–15.0)
Lymphocytes Relative: 30.1 % (ref 12.0–46.0)
Lymphs Abs: 2.3 10*3/uL (ref 0.7–4.0)
MCHC: 33.1 g/dL (ref 30.0–36.0)
MCV: 87.9 fL (ref 78.0–100.0)
Monocytes Absolute: 0.7 10*3/uL (ref 0.1–1.0)
Monocytes Relative: 8.6 % (ref 3.0–12.0)
Neutro Abs: 4.5 10*3/uL (ref 1.4–7.7)
Neutrophils Relative %: 58.9 % (ref 43.0–77.0)
Platelets: 294 10*3/uL (ref 150.0–400.0)
RBC: 4.83 Mil/uL (ref 3.87–5.11)
RDW: 13.7 % (ref 11.5–15.5)
WBC: 7.7 10*3/uL (ref 4.0–10.5)

## 2023-02-22 LAB — IBC + FERRITIN
Ferritin: 71.5 ng/mL (ref 10.0–291.0)
Iron: 77 ug/dL (ref 42–145)
Saturation Ratios: 19 % — ABNORMAL LOW (ref 20.0–50.0)
TIBC: 404.6 ug/dL (ref 250.0–450.0)
Transferrin: 289 mg/dL (ref 212.0–360.0)

## 2023-02-22 NOTE — Patient Instructions (Addendum)
Please call us in a month to get scheduled in April for a follow up and discuss possible colonoscopy.   Your provider has requested that you go to the basement level for lab work before leaving today. Press "B" on the elevator. The lab is located at the first door on the left as you exit the elevator.  Toileting tips to help with your constipation - Drink at least 64-80 ounces of water/liquid per day. - Establish a time to try to move your bowels every day.  For many people, this is after a cup of coffee or after a meal such as breakfast. - Sit all of the way back on the toilet keeping your back fairly straight and while sitting up, try to rest the tops of your forearms on your upper thighs.   - Raising your feet with a step stool/squatty potty can be helpful to improve the angle that allows your stool to pass through the rectum. - Relax the rectum feeling it bulge toward the toilet water.  If you feel your rectum raising toward your body, you are contracting rather than relaxing. - Breathe in and slowly exhale. "Belly breath" by expanding your belly towards your belly button. Keep belly expanded as you gently direct pressure down and back to the anus.  A low pitched GRRR sound can assist with increasing intra-abdominal pressure.  (Can also trying to blow on a pinwheel and make it move, this helps with the same belly breathing) - Repeat 3-4 times. If unsuccessful, contract the pelvic floor to restore normal tone and get off the toilet.  Avoid excessive straining. - To reduce excessive wiping by teaching your anus to normally contract, place hands on outer aspect of knees and resist knee movement outward.  Hold 5-10 second then place hands just inside of knees and resist inward movement of knees.  Hold 5 seconds.  Repeat a few times each way.  Go to the ER if unable to pass gas, severe AB pain, unable to hold down food, any shortness of breath of chest pain.  Please do sitz baths- these can be found  at the pharmacy. It is a Chief Operating Officer that is put in your toliet.  Please increase fiber or add benefiber, increase water and increase acitivity.    About Hemorrhoids  Hemorrhoids are swollen veins in the lower rectum and anus.  Also called piles, hemorrhoids are a common problem.  Hemorrhoids may be internal (inside the rectum) or external (around the anus).  Internal Hemorrhoids  Internal hemorrhoids are often painless, but they rarely cause bleeding.  The internal veins may stretch and fall down (prolapse) through the anus to the outside of the body.  The veins may then become irritated and painful.  External Hemorrhoids  External hemorrhoids can be easily seen or felt around the anal opening.  They are under the skin around the anus.  When the swollen veins are scratched or broken by straining, rubbing or wiping they sometimes bleed.  How Hemorrhoids Occur  Veins in the rectum and around the anus tend to swell under pressure.  Hemorrhoids can result from increased pressure in the veins of your anus or rectum.  Some sources of pressure are:  Straining to have a bowel movement because of constipation Waiting too long to have a bowel movement Coughing and sneezing often Sitting for extended periods of time, including on the toilet Diarrhea Obesity Trauma or injury to the anus Some liver diseases Stress Family history of hemorrhoids Pregnancy  Pregnant women should try to avoid becoming constipated, because they are more likely to have hemorrhoids during pregnancy.  In the last trimester of pregnancy, the enlarged uterus may press on blood vessels and causes hemorrhoids.  In addition, the strain of childbirth sometimes causes hemorrhoids after the birth.  Symptoms of Hemorrhoids  Some symptoms of hemorrhoids include: Swelling and/or a tender lump around the anus Itching, mild burning and bleeding around the anus Painful bowel movements with or without constipation Bright  red blood covering the stool, on toilet paper or in the toilet bowel.   Symptoms usually go away within a few days.  Always talk to your doctor about any bleeding to make sure it is not from some other causes.  Diagnosing and Treating Hemorrhoids  Diagnosis is made by an examination by your healthcare provider.  Special test can be performed by your doctor.    Most cases of hemorrhoids can be treated with: High-fiber diet: Eat more high-fiber foods, which help prevent constipation.  Ask for more detailed fiber information on types and sources of fiber from your healthcare provider. Fluids: Drink plenty of water.  This helps soften bowel movements so they are easier to pass. Sitz baths and cold packs: Sitting in lukewarm water two or three times a day for 15 minutes cleases the anal area and may relieve discomfort.  If the water is too hot, swelling around the anus will get worse.  Placing a cloth-covered ice pack on the anus for ten minutes four times a day can also help reduce selling.  Gently pushing a prolapsed hemorrhoid back inside after the bath or ice pack can be helpful. Medications: For mild discomfort, your healthcare provider may suggest over-the-counter pain medication or prescribe a cream or ointment for topical use.  The cream may contain witch hazel, zinc oxide or petroleum jelly.  Medicated suppositories are also a treatment option.  Always consult your doctor before applying medications or creams. Procedures and surgeries: There are also a number of procedures and surgeries to shrink or remove hemorrhoids in more serious cases.  Talk to your physician about these options.  You can often prevent hemorrhoids or keep them from becoming worse by maintaining a healthy lifestyle.  Eat a fiber-rich diet of fruits, vegetables and whole grains.  Also, drink plenty of water and exercise regularly.   2007, [employer] Doc.30

## 2023-02-22 NOTE — Progress Notes (Signed)
02/22/2023 Stacy Brady [id number] 1960-06-12  Referring provider: Creola Corn, MD Primary GI doctor: Dr. Leone Payor  ASSESSMENT AND PLAN:   FOBT +  No overt GI bleeding Neg hemoccult card in the office today with hemorrhoids Will check CBC, iron ferritin Will schedule patient for colonoscopy however patient had recent left retinal detachment surgery and was unable to lay on her left side for the duration of the colonoscopy, with patient's negative Hemoccult today if she has no iron deficiency we will wait 2 to 3 months prior to scheduling colonoscopy which patient is due for. If she does have an anemia will discuss with Dr. Leone Payor and her eye doctor about timing and how to proceed with the procedure. Will also get clearance from eye doctor when we would be able to have her in left decubitus position.  Constipation with hemorrhoids on exam BM every other day, on colon cleanse Lower AB pain, had negative pelvic US Add on MiraLAX  Diverticulosis No fever, chills Will call if any symptoms. Add on fiber supplement, avoid NSAIDS, information given  Left retinal detachment surgery Dec 6th Dr. Loma Sousa at [facility] Needs to be on her right side, can not have head below her heart Or will need to wait 3 months  Patient Care Team: Creola Corn, MD as PCP - General (Internal Medicine)  HISTORY OF PRESENT ILLNESS: 63 y.o. female with a past medical history of hypertension, nephrolithiasis, history of colon polyps last colonoscopy 02/2018 with 2 adenomatous polyps recall 2025 and others listed below presents for evaluation of FOBT.  Discussed the use of AI scribe software for clinical note transcription with the patient, who gave verbal consent to proceed.  History of Present Illness   The patient, with a history of positive fecal occult blood tests, presents for further evaluation. She reports no visible blood in the stool, no dark black stool, and no red stool. She occasionally  experiences a smell that she describes as 'bloody' but sees no visible evidence of blood in the stool. She also reports occasional lower abdominal discomfort, which she describes as 'tenderness,' but no consistent pattern has been identified. She has a family history of diverticulitis. She has regular bowel movements, typically every day or every other day. She has no upper GI symptoms, no trouble swallowing, no heartburn, no reflux, and no nausea or vomiting. She has a history of detached retina surgery in December, which may impact the positioning for potential procedures.      She  reports that she has never smoked. She has never used smokeless tobacco. She reports that she does not drink alcohol and does not use drugs.  RELEVANT GI HISTORY, LABS, IMAGING: 02/18/2018 colonoscopy for personal history of adenomatous polyps excellent prep with MiraLAX - Two 2 to 3 mm polyps in the descending colon and in the transverse colon, removed with a cold snare. Resected and retrieved. - Diverticulosis in the sigmoid colon. - Melanotic mucosa in the entire examined colon. - The examination was otherwise normal on direct and retroflexion views. - Personal history of colonic polyp adenoma. Pathology showed adenomatous polyps recall 2025  CBC    Component Value Date/Time   WBC 10.5 02/07/2017 1628   RBC 4.77 02/07/2017 1628   HGB 14.3 02/07/2017 1628   HCT 43.3 02/07/2017 1628   PLT 298 02/07/2017 1628   MCV 90.8 02/07/2017 1628   MCH 30.0 02/07/2017 1628   MCHC 33.0 02/07/2017 1628   RDW 13.9 02/07/2017 1628  No results for input(s): "HGB" in the last 8760 hours.  CMP     Component Value Date/Time   NA 140 02/07/2017 1628   K 3.4 (L) 02/07/2017 1628   CL 105 02/07/2017 1628   CO2 26 02/07/2017 1628   GLUCOSE 101 (H) 02/07/2017 1628   BUN 18 02/07/2017 1628   CREATININE 0.88 02/07/2017 1628   CALCIUM 9.3 02/07/2017 1628   GFRNONAA >60 02/07/2017 1628   GFRAA >60 02/07/2017 1628       No  data to display            Current Medications:   Current Outpatient Medications (Endocrine & Metabolic):    levothyroxine (SYNTHROID) 175 MCG tablet, Take 175 mcg by mouth daily before breakfast.  Current Outpatient Medications (Cardiovascular):    bisoprolol-hydrochlorothiazide (ZIAC) 2.5-6.25 MG per tablet, Take 2 tablets by mouth daily.    omega-3 acid ethyl esters (LOVAZA) 1 g capsule, Take 2 g by mouth daily.   rosuvastatin (CRESTOR) 10 MG tablet, Take 10 mg by mouth daily.  Current Outpatient Medications (Respiratory):    sodium chloride (OCEAN) 0.65 % SOLN nasal spray, Place 1 spray into both nostrils at bedtime.  Current Outpatient Medications (Analgesics):    acetaminophen (TYLENOL) 650 MG CR tablet, Take 650 mg by mouth every 8 (eight) hours as needed for pain. Take 2 daily prn    naproxen sodium (ANAPROX) 220 MG tablet, Take 220 mg by mouth 2 (two) times daily as needed (pain).   Current Outpatient Medications (Hematological):    Cyanocobalamin (VITAMIN B12 PO), Take 1 capsule by mouth daily.  Current Outpatient Medications (Other):    Ascorbic Acid (VITA-C PO), Take 1 tablet by mouth daily.   Ascorbic Acid (VITAMIN C PO), Take 1 tablet by mouth daily.   cholecalciferol (VITAMIN D) 1000 units tablet, Take 1,000 Units by mouth daily.   diclofenac Sodium (VOLTAREN) 1 % GEL, Apply 2 g topically as needed.   hypromellose (SYSTANE OVERNIGHT THERAPY) 0.3 % GEL ophthalmic ointment, Place 1 application into both eyes at bedtime.   magnesium gluconate (MAGONATE) 500 MG tablet, Take 500 mg by mouth daily.     MAGNESIUM PO, Take 1 tablet by mouth daily.   Misc Natural Products (COLON HERBAL CLEANSER PO), Take 1 tablet by mouth daily.    Multiple Vitamins-Minerals (ZINC PO), Take 1 tablet by mouth as needed.   Pyridoxine HCl (VITAMIN B6 PO), Take 1 capsule by mouth daily.  Medical History:  Past Medical History:  Diagnosis Date   Anemia    Arthritis    Diverticulosis     HLD (hyperlipidemia)    Hypertension    Nephrolithiasis    Thyroid disease    Allergies: No Known Allergies   Surgical History:  She  has a past surgical history that includes Tonsillectomy; Cesarean section; Lithotripsy; Colonoscopy w/ biopsies (08/22/2010); Breast biopsy (Right, 2016); Colonoscopy; Polypectomy; Retinal detachment surgery (Left, 01/11/2023); Replacement total knee (Left); Thumb arthroscopy (Bilateral); Carpal tunnel release (Left); and Elbow surgery (Left). Family History:  Her family history includes Colon polyps in her father; Diabetes in her father, mother, and paternal grandmother; Heart disease in her brother and paternal grandmother; Hypertension in her brother, father, and son; Kidney cancer in her mother; Kidney failure in her paternal grandmother.  REVIEW OF SYSTEMS  : All other systems reviewed and negative except where noted in the History of Present Illness.  PHYSICAL EXAM: BP 110/80 (BP Location: Left Arm, Patient Position: Sitting, Cuff Size: Normal)   Pulse  60   Ht 5' (1.524 m) Comment: height measured without shoes  Wt 181 lb 6 oz (82.3 kg)   LMP 08/11/2010   BMI 35.42 kg/m  General Appearance: Well nourished, in no apparent distress. Head:   Normocephalic and atraumatic. Eyes:  sclerae anicteric,conjunctive pink  Respiratory: Respiratory effort normal, BS equal bilaterally without rales, rhonchi, wheezing. Cardio: RRR with no MRGs. Peripheral pulses intact.  Abdomen: Soft,  Obese ,active bowel sounds. No tenderness . Marland Kitchen No masses. Rectal: Normal external rectal exam, normal rectal tone, appreciated internal hemorrhoids, non-tender, no masses, , brown stool, hemoccult Negative Musculoskeletal: Full ROM, Normal gait. Without edema. Skin:  Dry and intact without significant lesions or rashes Neuro: Alert and  oriented x4;  No focal deficits. Psych:  Cooperative. Normal mood and affect.    Doree Albee, PA-C 3:50 PM

## 2023-02-27 NOTE — Progress Notes (Signed)
Agree, it was a situation I had not come across yet with GI.  Without anemia and neg fobt in office, plan is for her to follow up in two to three months and she should be good to schedule.  Marchelle Folks

## 2023-03-26 DIAGNOSIS — H3322 Serous retinal detachment, left eye: Secondary | ICD-10-CM | POA: Diagnosis not present

## 2023-03-26 DIAGNOSIS — H25813 Combined forms of age-related cataract, bilateral: Secondary | ICD-10-CM | POA: Diagnosis not present

## 2023-06-03 DIAGNOSIS — H25813 Combined forms of age-related cataract, bilateral: Secondary | ICD-10-CM | POA: Diagnosis not present

## 2023-06-05 DIAGNOSIS — S233XXA Sprain of ligaments of thoracic spine, initial encounter: Secondary | ICD-10-CM | POA: Diagnosis not present

## 2023-06-05 DIAGNOSIS — S134XXA Sprain of ligaments of cervical spine, initial encounter: Secondary | ICD-10-CM | POA: Diagnosis not present

## 2023-06-05 DIAGNOSIS — S73102A Unspecified sprain of left hip, initial encounter: Secondary | ICD-10-CM | POA: Diagnosis not present

## 2023-06-05 DIAGNOSIS — S338XXA Sprain of other parts of lumbar spine and pelvis, initial encounter: Secondary | ICD-10-CM | POA: Diagnosis not present

## 2023-06-13 DIAGNOSIS — H33002 Unspecified retinal detachment with retinal break, left eye: Secondary | ICD-10-CM | POA: Diagnosis not present

## 2023-06-13 DIAGNOSIS — H25812 Combined forms of age-related cataract, left eye: Secondary | ICD-10-CM | POA: Diagnosis not present

## 2023-06-13 DIAGNOSIS — H2511 Age-related nuclear cataract, right eye: Secondary | ICD-10-CM | POA: Diagnosis not present

## 2023-06-13 DIAGNOSIS — H04123 Dry eye syndrome of bilateral lacrimal glands: Secondary | ICD-10-CM | POA: Diagnosis not present

## 2023-06-27 ENCOUNTER — Encounter: Payer: Self-pay | Admitting: Internal Medicine

## 2023-06-28 DIAGNOSIS — H25812 Combined forms of age-related cataract, left eye: Secondary | ICD-10-CM | POA: Diagnosis not present

## 2023-06-28 DIAGNOSIS — H52222 Regular astigmatism, left eye: Secondary | ICD-10-CM | POA: Diagnosis not present

## 2023-07-11 DIAGNOSIS — S134XXA Sprain of ligaments of cervical spine, initial encounter: Secondary | ICD-10-CM | POA: Diagnosis not present

## 2023-07-11 DIAGNOSIS — S338XXA Sprain of other parts of lumbar spine and pelvis, initial encounter: Secondary | ICD-10-CM | POA: Diagnosis not present

## 2023-07-11 DIAGNOSIS — S73102A Unspecified sprain of left hip, initial encounter: Secondary | ICD-10-CM | POA: Diagnosis not present

## 2023-07-11 DIAGNOSIS — S233XXA Sprain of ligaments of thoracic spine, initial encounter: Secondary | ICD-10-CM | POA: Diagnosis not present

## 2023-08-07 DIAGNOSIS — H26492 Other secondary cataract, left eye: Secondary | ICD-10-CM | POA: Diagnosis not present

## 2023-08-07 DIAGNOSIS — Z961 Presence of intraocular lens: Secondary | ICD-10-CM | POA: Diagnosis not present

## 2023-08-28 DIAGNOSIS — Z961 Presence of intraocular lens: Secondary | ICD-10-CM | POA: Diagnosis not present

## 2023-08-28 DIAGNOSIS — H33002 Unspecified retinal detachment with retinal break, left eye: Secondary | ICD-10-CM | POA: Diagnosis not present

## 2023-11-04 ENCOUNTER — Other Ambulatory Visit: Payer: Self-pay | Admitting: Internal Medicine

## 2023-11-04 ENCOUNTER — Telehealth: Payer: Self-pay | Admitting: Physician Assistant

## 2023-11-04 DIAGNOSIS — Z1231 Encounter for screening mammogram for malignant neoplasm of breast: Secondary | ICD-10-CM

## 2023-11-04 NOTE — Telephone Encounter (Signed)
 Received a call from patient stating she is due for colonoscopy, but recall notes advise her to come in to see amanda for OV first, she is refusing. Is requesting nurse fu call to discuss scheduling. Please review and advise   Thank you

## 2023-11-04 NOTE — Telephone Encounter (Signed)
 Please see note below. Pt stated that she is wanting to schedule an Colonoscopy and refuses to come in for an office visit to see a provider. Chart was reviewed and noted documentation on referral tab  that stated that recommendations were for pt to see Alan Coombs PA prior to scheduling Colonoscopy: Pt made aware. Pt stated that she refused to have an office visit.  Please review and advise

## 2023-11-05 ENCOUNTER — Ambulatory Visit (HOSPITAL_COMMUNITY)
Admission: RE | Admit: 2023-11-05 | Discharge: 2023-11-05 | Disposition: A | Discharge status: Home / Self Care | Source: Ambulatory Visit | Attending: Urology | Admitting: Urology

## 2023-11-05 ENCOUNTER — Ambulatory Visit: Admitting: Urology

## 2023-11-05 VITALS — BP 131/83 | HR 74

## 2023-11-05 DIAGNOSIS — Z87442 Personal history of urinary calculi: Secondary | ICD-10-CM | POA: Diagnosis not present

## 2023-11-05 DIAGNOSIS — N200 Calculus of kidney: Secondary | ICD-10-CM

## 2023-11-05 DIAGNOSIS — I878 Other specified disorders of veins: Secondary | ICD-10-CM | POA: Diagnosis not present

## 2023-11-05 LAB — MICROSCOPIC EXAMINATION: Bacteria, UA: NONE SEEN

## 2023-11-05 LAB — URINALYSIS, ROUTINE W REFLEX MICROSCOPIC
Bilirubin, UA: NEGATIVE
Glucose, UA: NEGATIVE
Ketones, UA: NEGATIVE
Nitrite, UA: NEGATIVE
Protein,UA: NEGATIVE
Specific Gravity, UA: 1.01 (ref 1.005–1.030)
Urobilinogen, Ur: 0.2 mg/dL (ref 0.2–1.0)
pH, UA: 6 (ref 5.0–7.5)

## 2023-11-05 NOTE — Telephone Encounter (Signed)
 Pt made aware of Alan Coombs PA recommendations: Pt was scheduled for the previsit appointment on 11/07/2023 at 10:00 AM. Pt made aware.  Pt was scheduled for colonoscopy on 12/03/2023 at 9:00 AM with Dr. Avram. . Pt made aware.  Pt verbalized understanding with all questions answered.

## 2023-11-05 NOTE — Progress Notes (Signed)
 Chief Complaint: History of kidney stones  History of Present Illness:  Stacy Brady is a 63 y.o. female who is seen in consultation from Onita Rush, MD for evaluation of history of urolithiasis.  Previously followed in our [place] office at [facility], [facility], [facility].  She has had interventions for stones before including lithotripsy.  Her last visit in [place] was approximately 3 years ago.  No stone episodes since that time.  She is not on any special dietary management.  She was known at last KUB to have some stones in her left lower pole.   Past Medical History:  Past Medical History:  Diagnosis Date   Anemia    Arthritis    Diverticulosis    HLD (hyperlipidemia)    Hypertension    Nephrolithiasis    Thyroid  disease     Past Surgical History:  Past Surgical History:  Procedure Laterality Date   BREAST BIOPSY Right 2016   CARPAL TUNNEL RELEASE Left    CESAREAN SECTION     1 time   COLONOSCOPY     COLONOSCOPY W/ BIOPSIES  08/22/2010   diminutive adenoma, diverticulosis, internal hemorrhoids, melanosis   ELBOW SURGERY Left    LITHOTRIPSY     nephrolithiasis   POLYPECTOMY     REPLACEMENT TOTAL KNEE Left    RETINAL DETACHMENT SURGERY Left 01/11/2023   THUMB ARTHROSCOPY Bilateral    TONSILLECTOMY      Allergies:  No Known Allergies  Family History:  Family History  Problem Relation Age of Onset   Diabetes Mother    Kidney cancer Mother    Diabetes Father    Colon polyps Father    Hypertension Father    Heart disease Brother    Hypertension Brother    Diabetes Paternal Grandmother    Heart disease Paternal Grandmother    Kidney failure Paternal Grandmother    Hypertension Son    Rectal cancer Neg Hx    Stomach cancer Neg Hx     Social History:  Social History   Tobacco Use   Smoking status: Never   Smokeless tobacco: Never  Vaping Use   Vaping status: Never Used  Substance Use Topics   Alcohol  use: No   Drug use: No    Review of symptoms:   Constitutional:  Negative for unexplained weight loss, night sweats, fever, chills ENT:  Negative for nose bleeds, sinus pain, painful swallowing CV:  Negative for chest pain, shortness of breath, exercise intolerance, palpitations, loss of consciousness Resp:  Negative for cough, wheezing, shortness of breath GI:  Negative for nausea, vomiting, diarrhea, bloody stools GU:  Positives noted in HPI; otherwise negative for gross hematuria, dysuria, urinary incontinence Neuro:  Negative for seizures, poor balance, limb weakness, slurred speech Psych:  Negative for lack of energy, depression, anxiety Endocrine:  Negative for polydipsia, polyuria, symptoms of hypoglycemia (dizziness, hunger, sweating) Hematologic:  Negative for anemia, purpura, petechia, prolonged or excessive bleeding, use of anticoagulants  Allergic:  Negative for difficulty breathing or choking as a result of exposure to anything; no shellfish allergy; no allergic response (rash/itch) to materials, foods  Physical exam: LMP 08/11/2010  GENERAL APPEARANCE:  Well appearing, well developed, well nourished, NAD HEENT: Atraumatic, Normocephalic, oropharynx clear. NECK: Supple without lymphadenopathy or thyromegaly. LUNGS: Clear to auscultation bilaterally. HEART: Regular Rate EXTREMITIES: Moves all extremities well.  Without clubbing, cyanosis, or edema. NEUROLOGIC:  Alert and oriented x 3, normal gait, CN II-XII grossly intact.  MENTAL STATUS:  Appropriate. BACK:  Non-tender to  palpation.  No CVAT SKIN:  Warm, dry and intact.    Results:   I have reviewed referring/prior physicians records[facility]  I have reviewed urinalysi  I reviewed prior imaging studies-last KUB from October 2022 reviewed.  3-4 calculi overlying left lower pole, the largest of which is 5 mm. -- Assessment: Left renal calculi, asymptomatic with history of prior intervention   Plan: 1.  We will check KUB today  2.  Stone  prevention diet discussed  3.  I will have her come back in a couple of years for recheck

## 2023-11-07 ENCOUNTER — Ambulatory Visit (AMBULATORY_SURGERY_CENTER)

## 2023-11-07 VITALS — Ht 60.0 in | Wt 180.0 lb

## 2023-11-07 DIAGNOSIS — Z860100 Personal history of colon polyps, unspecified: Secondary | ICD-10-CM

## 2023-11-07 NOTE — Progress Notes (Signed)
 No egg or soy allergy known to patient  No issues known to pt with past sedation with any surgeries or procedures Patient denies ever being told they had issues or difficulty with intubation  No FH of Malignant Hyperthermia Pt is not on diet pills Pt is not on  home 02  Pt is not on blood thinners  Pt denies issues with constipation  No A fib or A flutter Have any cardiac testing pending--No Pt can ambulate  Pt denies use of chewing tobacco Discussed diabetic I weight loss medication holds Discussed NSAID holds Checked BMI Pt instructed to use [employer] or GoodRx for a price reduction on prep  Patient's chart reviewed.  Pre visit completed

## 2023-11-11 ENCOUNTER — Ambulatory Visit: Payer: Self-pay | Admitting: Urology

## 2023-11-11 NOTE — Telephone Encounter (Signed)
 FYI and advise please

## 2023-11-13 ENCOUNTER — Ambulatory Visit
Admission: RE | Admit: 2023-11-13 | Discharge: 2023-11-13 | Disposition: A | Discharge status: Home / Self Care | Source: Ambulatory Visit | Attending: Internal Medicine | Admitting: Internal Medicine

## 2023-11-13 DIAGNOSIS — Z1231 Encounter for screening mammogram for malignant neoplasm of breast: Secondary | ICD-10-CM

## 2023-11-20 DIAGNOSIS — Z1151 Encounter for screening for human papillomavirus (HPV): Secondary | ICD-10-CM | POA: Diagnosis not present

## 2023-11-20 DIAGNOSIS — Z780 Asymptomatic menopausal state: Secondary | ICD-10-CM | POA: Diagnosis not present

## 2023-11-20 DIAGNOSIS — E039 Hypothyroidism, unspecified: Secondary | ICD-10-CM | POA: Diagnosis not present

## 2023-11-20 DIAGNOSIS — Z1389 Encounter for screening for other disorder: Secondary | ICD-10-CM | POA: Diagnosis not present

## 2023-11-20 DIAGNOSIS — Z130 Encounter for screening for diseases of the blood and blood-forming organs and certain disorders involving the immune mechanism: Secondary | ICD-10-CM | POA: Diagnosis not present

## 2023-11-20 DIAGNOSIS — I10 Essential (primary) hypertension: Secondary | ICD-10-CM | POA: Diagnosis not present

## 2023-11-20 DIAGNOSIS — Z124 Encounter for screening for malignant neoplasm of cervix: Secondary | ICD-10-CM | POA: Diagnosis not present

## 2023-11-20 DIAGNOSIS — E559 Vitamin D deficiency, unspecified: Secondary | ICD-10-CM | POA: Diagnosis not present

## 2023-11-20 DIAGNOSIS — E785 Hyperlipidemia, unspecified: Secondary | ICD-10-CM | POA: Diagnosis not present

## 2023-11-20 DIAGNOSIS — Z01419 Encounter for gynecological examination (general) (routine) without abnormal findings: Secondary | ICD-10-CM | POA: Diagnosis not present

## 2023-11-26 ENCOUNTER — Encounter: Payer: Self-pay | Admitting: Internal Medicine

## 2023-12-02 NOTE — Progress Notes (Unsigned)
 [facility] History and Physical   Primary Care Physician:  Onita Rush, MD   Reason for Procedure:    Encounter Diagnosis  Name Primary?   Fecal occult blood test positive Yes     Plan:    colonoscopy     HPI: Stacy Brady is a 63 y.o. female seen in 02/2023 by Alan Coombs, PA-C - had iFOBT + stool. Also w./ hx colon adenomas, and last exam 2020 colonoscopy w/ 2 max 3 mm.  Lab Results  Component Value Date   WBC 7.7 02/22/2023   HGB 14.1 02/22/2023   HCT 42.5 02/22/2023   MCV 87.9 02/22/2023   PLT 294.0 02/22/2023   Lab Results  Component Value Date   IRON 77 02/22/2023   TIBC 404.6 02/22/2023   FERRITIN 71.5 02/22/2023      Past Medical History:  Diagnosis Date   Anemia    Arthritis    Diverticulosis    HLD (hyperlipidemia)    Hypertension    Nephrolithiasis    Thyroid  disease     Past Surgical History:  Procedure Laterality Date   BREAST BIOPSY Right 2016   CARPAL TUNNEL RELEASE Left    with left thumb repair   CESAREAN SECTION     1 time   COLONOSCOPY     COLONOSCOPY W/ BIOPSIES  08/22/2010   diminutive adenoma, diverticulosis, internal hemorrhoids, melanosis   ELBOW SURGERY Left    LITHOTRIPSY     nephrolithiasis   POLYPECTOMY     REPLACEMENT TOTAL KNEE Left    RETINAL DETACHMENT SURGERY Left 01/11/2023   THUMB ARTHROSCOPY Bilateral    TOE SURGERY Right    spur rmovl   TONSILLECTOMY       Current Outpatient Medications  Medication Sig Dispense Refill   acetaminophen  (TYLENOL ) 650 MG CR tablet Take 650 mg by mouth every 8 (eight) hours as needed for pain. Take 2 daily prn      Ascorbic Acid (VITA-C PO) Take 1 tablet by mouth daily.     Ascorbic Acid (VITAMIN C PO) Take 1 tablet by mouth daily.     bisoprolol-hydrochlorothiazide (ZIAC) 2.5-6.25 MG per tablet Take 2 tablets by mouth daily.      cetirizine (ZYRTEC) 10 MG tablet Take 10 mg by mouth daily.     cholecalciferol (VITAMIN D) 1000 units tablet Take 1,000 Units  by mouth daily.     Cyanocobalamin (VITAMIN B12 PO) Take 1 capsule by mouth daily.     cycloSPORINE (RESTASIS) 0.05 % ophthalmic emulsion Place 1 drop into both eyes 2 (two) times daily.     diclofenac Sodium (VOLTAREN) 1 % GEL Apply 2 g topically as needed.     diphenhydrAMINE HCl, Sleep, (EQL NIGHTTIME SLEEP AID) 25 MG CAPS Take 3 tablets by mouth at bedtime.     hypromellose (SYSTANE OVERNIGHT THERAPY) 0.3 % GEL ophthalmic ointment Place 1 application into both eyes at bedtime.     levothyroxine (SYNTHROID) 175 MCG tablet Take 175 mcg by mouth daily before breakfast.     magnesium gluconate (MAGONATE) 500 MG tablet Take 500 mg by mouth daily.       MAGNESIUM PO Take 1 tablet by mouth daily.     Misc Natural Products (COLON HERBAL CLEANSER PO) Take 1 tablet by mouth daily.      Multiple Vitamins-Minerals (ZINC PO) Take 1 tablet by mouth as needed.     naproxen  sodium (ANAPROX ) 220 MG tablet Take 220 mg by mouth 2 (two) times daily  as needed (pain).      omega-3 acid ethyl esters (LOVAZA) 1 g capsule Take 2 g by mouth daily.     Pyridoxine HCl (VITAMIN B6 PO) Take 1 capsule by mouth daily.     rosuvastatin (CRESTOR) 10 MG tablet Take 10 mg by mouth daily.     sodium chloride  (OCEAN) 0.65 % SOLN nasal spray Place 1 spray into both nostrils at bedtime.     No current facility-administered medications for this visit.    Allergies as of 12/03/2023   (No Known Allergies)    Family History  Problem Relation Age of Onset   Diabetes Mother    Kidney cancer Mother    Diabetes Father    Colon polyps Father    Hypertension Father    Diabetes Paternal Grandmother    Heart disease Paternal Grandmother    Kidney failure Paternal Grandmother    Heart disease Brother    Hypertension Brother    Hypertension Son    Rectal cancer Neg Hx    Stomach cancer Neg Hx    Colon cancer Neg Hx    Esophageal cancer Neg Hx    Breast cancer Neg Hx     Social History   Socioeconomic History   Marital  status: Married    Spouse name: Not on file   Number of children: 2   Years of education: Not on file   Highest education level: Not on file  Occupational History   Not on file  Tobacco Use   Smoking status: Never   Smokeless tobacco: Never  Vaping Use   Vaping status: Never Used  Substance and Sexual Activity   Alcohol  use: No   Drug use: No   Sexual activity: Not on file  Other Topics Concern   Not on file  Social History Narrative   Not on file   Social Drivers of Health   Financial Resource Strain: Not on file  Food Insecurity: Not on file  Transportation Needs: Not on file  Physical Activity: Not on file  Stress: Not on file  Social Connections: Not on file  Intimate Partner Violence: Not on file    Review of Systems: Positive for *** All other review of systems negative except as mentioned in the HPI.  Physical Exam: Vital signs LMP 08/11/2010   General:   Alert,  Well-developed, well-nourished, pleasant and cooperative in NAD Lungs:  Clear throughout to auscultation.   Heart:  Regular rate and rhythm; no murmurs, clicks, rubs,  or gallops. Abdomen:  Soft, nontender and nondistended. Normal bowel sounds.   Neuro/Psych:  Alert and cooperative. Normal mood and affect. A and O x 3   @Stacy Brady  CHARLENA Commander, MD, [facility] [phone] (pager) 12/02/2023 7:40 PM@

## 2023-12-03 ENCOUNTER — Ambulatory Visit

## 2023-12-03 ENCOUNTER — Encounter: Payer: Self-pay | Admitting: Internal Medicine

## 2023-12-03 ENCOUNTER — Ambulatory Visit: Admitting: Internal Medicine

## 2023-12-03 VITALS — BP 104/62 | HR 59 | Temp 98.0°F | Resp 11 | Ht 60.0 in | Wt 180.0 lb

## 2023-12-03 DIAGNOSIS — E785 Hyperlipidemia, unspecified: Secondary | ICD-10-CM | POA: Diagnosis not present

## 2023-12-03 DIAGNOSIS — D120 Benign neoplasm of cecum: Secondary | ICD-10-CM

## 2023-12-03 DIAGNOSIS — Z860101 Personal history of adenomatous and serrated colon polyps: Secondary | ICD-10-CM | POA: Diagnosis not present

## 2023-12-03 DIAGNOSIS — Z1211 Encounter for screening for malignant neoplasm of colon: Secondary | ICD-10-CM | POA: Diagnosis not present

## 2023-12-03 DIAGNOSIS — Z860100 Personal history of colon polyps, unspecified: Secondary | ICD-10-CM

## 2023-12-03 DIAGNOSIS — I10 Essential (primary) hypertension: Secondary | ICD-10-CM | POA: Diagnosis not present

## 2023-12-03 DIAGNOSIS — R195 Other fecal abnormalities: Secondary | ICD-10-CM

## 2023-12-03 DIAGNOSIS — D124 Benign neoplasm of descending colon: Secondary | ICD-10-CM | POA: Diagnosis not present

## 2023-12-03 DIAGNOSIS — K5730 Diverticulosis of large intestine without perforation or abscess without bleeding: Secondary | ICD-10-CM | POA: Diagnosis not present

## 2023-12-03 DIAGNOSIS — K6389 Other specified diseases of intestine: Secondary | ICD-10-CM

## 2023-12-03 DIAGNOSIS — D122 Benign neoplasm of ascending colon: Secondary | ICD-10-CM

## 2023-12-03 DIAGNOSIS — K649 Unspecified hemorrhoids: Secondary | ICD-10-CM | POA: Diagnosis not present

## 2023-12-03 MED ORDER — SODIUM CHLORIDE 0.9 % IV SOLN: 500.0000 mL | INTRAVENOUS | Status: AC

## 2023-12-03 NOTE — Progress Notes (Signed)
 Sedate, gd SR, tolerated procedure well, VSS, report to RN

## 2023-12-03 NOTE — Progress Notes (Signed)
 Pt's states no medical or surgical changes since previsit or office visit.

## 2023-12-03 NOTE — Patient Instructions (Addendum)
 I removed 4 tiny polyps today.  They will be analyzed and I will let you know when it is an appropriate time to repeat a routine colonoscopy.  The patients like you do have a history of polyps and get regular colonoscopy I do not order experts recommend routine stool test for blood.  I would declined those in the future if offered.  I also saw diverticulosis which are pouches or pockets in the colon.  Please read the handout.  I also saw some small external hemorrhoids.  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  It is not a health hazard.  I will let you know pathology results and when to have another routine colonoscopy by mail and/or My Chart.  I appreciate the opportunity to care for you. Lupita CHARLENA Commander, MD, FACG  YOU HAD AN ENDOSCOPIC PROCEDURE TODAY AT THE [facility]:   Refer to the procedure report that was given to you for any specific questions about what was found during the examination.  If the procedure report does not answer your questions, please call your gastroenterologist to clarify.  If you requested that your care partner not be given the details of your procedure findings, then the procedure report has been included in a sealed envelope for you to review at your convenience later.  YOU SHOULD EXPECT: Some feelings of bloating in the abdomen. Passage of more gas than usual.  Walking can help get rid of the air that was put into your GI tract during the procedure and reduce the bloating. If you had a lower endoscopy (such as a colonoscopy or flexible sigmoidoscopy) you may notice spotting of blood in your stool or on the toilet paper. If you underwent a bowel prep for your procedure, you may not have a normal bowel movement for a few days.  Please Note:  You might notice some irritation and congestion in your nose or some drainage.  This is from the oxygen used during your  procedure.  There is no need for concern and it should clear up in a day or so.  SYMPTOMS TO REPORT IMMEDIATELY:  Following lower endoscopy (colonoscopy or flexible sigmoidoscopy):  Excessive amounts of blood in the stool  Significant tenderness or worsening of abdominal pains  Swelling of the abdomen that is new, acute  Fever of 100F or higher   For urgent or emergent issues, a gastroenterologist can be reached at any hour by calling [phone]. Do not use MyChart messaging for urgent concerns.    DIET:  We do recommend a small meal at first, but then you may proceed to your regular diet.  Drink plenty of fluids but you should avoid alcoholic beverages for 24 hours.  MEDICATIONS: Continue present medications.  FOLLOW UP: Await pathology results. Repeat colonoscopy is recommended for surveillance. The colonoscopy date will be determined after pathology results from today's exam become available for review.  Educational handouts given to patient: Polyps, Diverticulosis, Hemorrhoids.  Thank you for allowing us  to provide for your healthcare needs today.  ACTIVITY:  You should plan to take it easy for the rest of today and you should NOT DRIVE or use heavy machinery until tomorrow (because of the sedation medicines used during the test).    FOLLOW UP: Our staff will call the number listed on your records the next business day following your procedure.  We will call around 7:15- 8:00  am to check on you and address any questions or concerns that you may have regarding the information given to you following your procedure. If we do not reach you, we will leave a message.     If any biopsies were taken you will be contacted by phone or by letter within the next 1-3 weeks.  Please call us  at [phone] if you have not heard about the biopsies in 3 weeks.    SIGNATURES/CONFIDENTIALITY: You and/or your care partner have signed paperwork which will be entered into your electronic  medical record.  These signatures attest to the fact that that the information above on your After Visit Summary has been reviewed and is understood.  Full responsibility of the confidentiality of this discharge information lies with you and/or your care-partner.

## 2023-12-03 NOTE — Progress Notes (Signed)
 Called to room to assist during endoscopic procedure.  Patient ID and intended procedure confirmed with present staff. Received instructions for my participation in the procedure from the performing physician.

## 2023-12-03 NOTE — Op Note (Signed)
 [facility] Patient Name: Stacy Brady Procedure Date: 12/03/2023 9:50 AM MRN: [id number] Endoscopist: Lupita FORBES Commander , MD, [id number] Age: 63 Referring MD:  Date of Birth: 11/09/1960 Gender: Female Account #: [id number] Procedure:                Colonoscopy Indications:              Heme positive stool Medicines:                Monitored Anesthesia Care Procedure:                Pre-Anesthesia Assessment:                           - Prior to the procedure, a History and Physical                            was performed, and patient medications and                            allergies were reviewed. The patient's tolerance of                            previous anesthesia was also reviewed. The risks                            and benefits of the procedure and the sedation                            options and risks were discussed with the patient.                            All questions were answered, and informed consent                            was obtained. Prior Anticoagulants: The patient has                            taken no anticoagulant or antiplatelet agents. ASA                            Grade Assessment: II - A patient with mild systemic                            disease. After reviewing the risks and benefits,                            the patient was deemed in satisfactory condition to                            undergo the procedure.                           After obtaining informed consent, the colonoscope  was passed under direct vision. Throughout the                            procedure, the patient's blood pressure, pulse, and                            oxygen saturations were monitored continuously. The                            Olympus Scope SN [id number] was introduced through the                            anus and advanced to the the cecum, identified by                            appendiceal orifice and ileocecal  valve. The                            colonoscopy was performed without difficulty. The                            patient tolerated the procedure well. The quality                            of the bowel preparation was good. The ileocecal                            valve, appendiceal orifice, and rectum were                            photographed. The bowel preparation used was                            Miralax via split dose instruction. Scope In: 10:08:43 AM Scope Out: 10:29:41 AM Scope Withdrawal Time: 0 hours 15 minutes 27 seconds  Total Procedure Duration: 0 hours 20 minutes 58 seconds  Findings:                 Hemorrhoids were found on perianal exam.                           Three sessile polyps were found in the descending                            colon and ascending colon. The polyps were 3 to 4                            mm in size. These polyps were removed with a cold                            snare. Resection and retrieval were complete.                            Verification of  patient identification for the                            specimen was done. Estimated blood loss was minimal.                           A 2 mm polyp was found in the cecum. The polyp was                            sessile. The polyp was removed with a cold biopsy                            forceps. Resection and retrieval were complete.                            Verification of patient identification for the                            specimen was done. Estimated blood loss was minimal.                           Multiple large-mouthed and medium-mouthed                            diverticula were found in the sigmoid colon.                           The exam was otherwise without abnormality on                            direct and retroflexion views except for diffuse                            moderate melanosis. Complications:            No immediate complications. Estimated Blood Loss:      Estimated blood loss was minimal. Impression:               - Hemorrhoids found on perianal exam.                           - Three 3 to 4 mm polyps in the descending colon                            and in the ascending colon, removed with a cold                            snare. Resected and retrieved.                           - One 2 mm polyp in the cecum, removed with a cold                            biopsy forceps. Resected and retrieved.                           -  Moderate diverticulosis in the sigmoid colon.                           - The examination was otherwise normal on direct                            and retroflexion views except for diffuse moderate                            melanosis.                           - Personal history of colonic polyps. Two dimintive                            adenomas 2020 and one in 2012 Recommendation:           - Patient has a contact number available for                            emergencies. The signs and symptoms of potential                            delayed complications were discussed with the                            patient. Return to normal activities tomorrow.                            Written discharge instructions were provided to the                            patient.                           - Resume previous diet.                           - Continue present medications.                           - Repeat colonoscopy is recommended for                            surveillance. The colonoscopy date will be                            determined after pathology results from today's                            exam become available for review. Lupita FORBES Commander, MD 12/03/2023 10:41:54 AM This report has been signed electronically.

## 2023-12-04 ENCOUNTER — Telehealth: Payer: Self-pay

## 2023-12-04 NOTE — Telephone Encounter (Signed)
 Left message

## 2023-12-05 DIAGNOSIS — E039 Hypothyroidism, unspecified: Secondary | ICD-10-CM | POA: Diagnosis not present

## 2023-12-05 DIAGNOSIS — E559 Vitamin D deficiency, unspecified: Secondary | ICD-10-CM | POA: Diagnosis not present

## 2023-12-05 DIAGNOSIS — E669 Obesity, unspecified: Secondary | ICD-10-CM | POA: Diagnosis not present

## 2023-12-05 DIAGNOSIS — G4700 Insomnia, unspecified: Secondary | ICD-10-CM | POA: Diagnosis not present

## 2023-12-05 DIAGNOSIS — N393 Stress incontinence (female) (male): Secondary | ICD-10-CM | POA: Diagnosis not present

## 2023-12-05 DIAGNOSIS — M1990 Unspecified osteoarthritis, unspecified site: Secondary | ICD-10-CM | POA: Diagnosis not present

## 2023-12-05 DIAGNOSIS — Z0000 Encounter for general adult medical examination without abnormal findings: Secondary | ICD-10-CM | POA: Diagnosis not present

## 2023-12-05 DIAGNOSIS — I10 Essential (primary) hypertension: Secondary | ICD-10-CM | POA: Diagnosis not present

## 2023-12-05 DIAGNOSIS — Z1331 Encounter for screening for depression: Secondary | ICD-10-CM | POA: Diagnosis not present

## 2023-12-05 DIAGNOSIS — E785 Hyperlipidemia, unspecified: Secondary | ICD-10-CM | POA: Diagnosis not present

## 2023-12-05 DIAGNOSIS — I2510 Atherosclerotic heart disease of native coronary artery without angina pectoris: Secondary | ICD-10-CM | POA: Diagnosis not present

## 2023-12-05 DIAGNOSIS — N200 Calculus of kidney: Secondary | ICD-10-CM | POA: Diagnosis not present

## 2023-12-05 LAB — SURGICAL PATHOLOGY

## 2023-12-24 ENCOUNTER — Encounter: Payer: Self-pay | Admitting: Internal Medicine

## 2023-12-24 ENCOUNTER — Ambulatory Visit: Payer: Self-pay | Admitting: Internal Medicine

## 2023-12-24 DIAGNOSIS — H35372 Puckering of macula, left eye: Secondary | ICD-10-CM | POA: Diagnosis not present

## 2023-12-24 DIAGNOSIS — H25811 Combined forms of age-related cataract, right eye: Secondary | ICD-10-CM | POA: Diagnosis not present

## 2023-12-24 DIAGNOSIS — H3322 Serous retinal detachment, left eye: Secondary | ICD-10-CM | POA: Diagnosis not present

## 2023-12-24 DIAGNOSIS — Z961 Presence of intraocular lens: Secondary | ICD-10-CM | POA: Diagnosis not present

## 2024-01-16 DIAGNOSIS — Z780 Asymptomatic menopausal state: Secondary | ICD-10-CM | POA: Diagnosis not present
